# Patient Record
Sex: Male | Born: 1950 | Race: Asian | Hispanic: No | Marital: Married | Smoking: Never smoker
Health system: Southern US, Community
[De-identification: ages and names within clinical notes are randomized; demographics above are authoritative.]

## PROBLEM LIST (undated history)

## (undated) DIAGNOSIS — I10 Essential (primary) hypertension: Secondary | ICD-10-CM

## (undated) DIAGNOSIS — E119 Type 2 diabetes mellitus without complications: Secondary | ICD-10-CM

## (undated) DIAGNOSIS — F419 Anxiety disorder, unspecified: Secondary | ICD-10-CM

## (undated) HISTORY — DX: Anxiety disorder, unspecified: F41.9

## (undated) HISTORY — DX: Essential (primary) hypertension: I10

---

## 2014-11-25 ENCOUNTER — Ambulatory Visit (INDEPENDENT_AMBULATORY_CARE_PROVIDER_SITE_OTHER): Payer: Self-pay

## 2014-11-25 ENCOUNTER — Ambulatory Visit (INDEPENDENT_AMBULATORY_CARE_PROVIDER_SITE_OTHER): Payer: Self-pay | Admitting: Internal Medicine

## 2014-11-25 VITALS — BP 134/82 | HR 61 | Temp 97.9°F | Resp 18 | Ht 68.0 in | Wt 196.0 lb

## 2014-11-25 DIAGNOSIS — F419 Anxiety disorder, unspecified: Secondary | ICD-10-CM

## 2014-11-25 DIAGNOSIS — I152 Hypertension secondary to endocrine disorders: Secondary | ICD-10-CM | POA: Insufficient documentation

## 2014-11-25 DIAGNOSIS — I159 Secondary hypertension, unspecified: Secondary | ICD-10-CM

## 2014-11-25 DIAGNOSIS — I10 Essential (primary) hypertension: Secondary | ICD-10-CM

## 2014-11-25 DIAGNOSIS — E1159 Type 2 diabetes mellitus with other circulatory complications: Secondary | ICD-10-CM | POA: Insufficient documentation

## 2014-11-25 DIAGNOSIS — Z789 Other specified health status: Secondary | ICD-10-CM

## 2014-11-25 DIAGNOSIS — R42 Dizziness and giddiness: Secondary | ICD-10-CM

## 2014-11-25 LAB — POCT URINALYSIS DIPSTICK
Bilirubin, UA: NEGATIVE
Glucose, UA: NEGATIVE
Ketones, UA: NEGATIVE
LEUKOCYTES UA: NEGATIVE
NITRITE UA: NEGATIVE
Protein, UA: NEGATIVE
RBC UA: NEGATIVE
SPEC GRAV UA: 1.01
UROBILINOGEN UA: 0.2
pH, UA: 6.5

## 2014-11-25 LAB — POCT UA - MICROSCOPIC ONLY
Bacteria, U Microscopic: NEGATIVE
CASTS, UR, LPF, POC: NEGATIVE
Crystals, Ur, HPF, POC: NEGATIVE
EPITHELIAL CELLS, URINE PER MICROSCOPY: NEGATIVE
MUCUS UA: NEGATIVE
RBC, URINE, MICROSCOPIC: NEGATIVE
WBC, UR, HPF, POC: NEGATIVE
YEAST UA: NEGATIVE

## 2014-11-25 LAB — POCT CBC
Granulocyte percent: 58 %G (ref 37–80)
HEMATOCRIT: 47.9 % (ref 43.5–53.7)
Hemoglobin: 15.7 g/dL (ref 14.1–18.1)
LYMPH, POC: 2.2 (ref 0.6–3.4)
MCH: 26.4 pg — AB (ref 27–31.2)
MCHC: 32.8 g/dL (ref 31.8–35.4)
MCV: 80.5 fL (ref 80–97)
MID (CBC): 0.6 (ref 0–0.9)
MPV: 8.4 fL (ref 0–99.8)
POC Granulocyte: 3.9 (ref 2–6.9)
POC LYMPH %: 32.9 % (ref 10–50)
POC MID %: 9.1 % (ref 0–12)
Platelet Count, POC: 226 10*3/uL (ref 142–424)
RBC: 5.95 M/uL (ref 4.69–6.13)
RDW, POC: 13.4 %
WBC: 6.7 10*3/uL (ref 4.6–10.2)

## 2014-11-25 LAB — GLUCOSE, POCT (MANUAL RESULT ENTRY): POC Glucose: 95 mg/dl (ref 70–99)

## 2014-11-25 MED ORDER — CLONAZEPAM 1 MG PO TABS: 1.0000 mg | ORAL_TABLET | Freq: Two times a day (BID) | ORAL | Status: DC | PRN

## 2014-11-25 MED ORDER — QUINAPRIL HCL 10 MG PO TABS: 10.0000 mg | ORAL_TABLET | Freq: Every day | ORAL | Status: DC

## 2014-11-25 NOTE — Progress Notes (Signed)
   Subjective:    Patient ID: Omar Barker, male    DOB: 11/29/1950, 64 y.o.   MRN: [id number]  HPI Hx of anxiety and worry about his BP. He is on accupril for HTN. Speaks broken English, has interpretors with him. From [place].   Review of Systems     Objective:   Physical Exam  Constitutional: He is oriented to person, place, and time. He appears well-developed and well-nourished. No distress.  HENT:  Head: Normocephalic.  Eyes: Conjunctivae and EOM are normal. Pupils are equal, round, and reactive to light.  Neck: Normal range of motion. Neck supple. No thyromegaly present.  Cardiovascular: Normal rate, regular rhythm and normal heart sounds.  Exam reveals no gallop.   No murmur heard. Pulmonary/Chest: Effort normal and breath sounds normal.  Abdominal: Soft. Bowel sounds are normal. There is no tenderness.  Musculoskeletal: Normal range of motion.  Neurological: He is alert and oriented to person, place, and time. He exhibits normal muscle tone. Coordination normal.  Psychiatric: His speech is normal. Thought content normal. His mood appears anxious. He is agitated. Cognition and memory are normal.   EKG normal [facility] reading (PRIMARY) by  Dr.Million Maharaj normal cxr.         Assessment & Plan:  HTN Anxiety by hx

## 2014-11-25 NOTE — Patient Instructions (Signed)
Panic Attacks Panic attacks are sudden, short-livedsurges of severe anxiety, fear, or discomfort. They may occur for no reason when you are relaxed, when you are anxious, or when you are sleeping. Panic attacks may occur for a number of reasons:   Healthy people occasionally have panic attacks in extreme, life-threatening situations, such as war or natural disasters. Normal anxiety is a protective mechanism of the body that helps us react to danger (fight or flight response).  Panic attacks are often seen with anxiety disorders, such as panic disorder, social anxiety disorder, generalized anxiety disorder, and phobias. Anxiety disorders cause excessive or uncontrollable anxiety. They may interfere with your relationships or other life activities.  Panic attacks are sometimes seen with other mental illnesses, such as depression and posttraumatic stress disorder.  Certain medical conditions, prescription medicines, and drugs of abuse can cause panic attacks. SYMPTOMS  Panic attacks start suddenly, peak within 20 minutes, and are accompanied by four or more of the following symptoms:  Pounding heart or fast heart rate (palpitations).  Sweating.  Trembling or shaking.  Shortness of breath or feeling smothered.  Feeling choked.  Chest pain or discomfort.  Nausea or strange feeling in your stomach.  Dizziness, light-headedness, or feeling like you will faint.  Chills or hot flushes.  Numbness or tingling in your lips or hands and feet.  Feeling that things are not real or feeling that you are not yourself.  Fear of losing control or going crazy.  Fear of dying. Some of these symptoms can mimic serious medical conditions. For example, you may think you are having a heart attack. Although panic attacks can be very scary, they are not life threatening. DIAGNOSIS  Panic attacks are diagnosed through an assessment by your health care provider. Your health care provider will ask  questions about your symptoms, such as where and when they occurred. Your health care provider will also ask about your medical history and use of alcohol and drugs, including prescription medicines. Your health care provider may order blood tests or other studies to rule out a serious medical condition. Your health care provider may refer you to a mental health professional for further evaluation. TREATMENT   Most healthy people who have one or two panic attacks in an extreme, life-threatening situation will not require treatment.  The treatment for panic attacks associated with anxiety disorders or other mental illness typically involves counseling with a mental health professional, medicine, or a combination of both. Your health care provider will help determine what treatment is best for you.  Panic attacks due to physical illness usually go away with treatment of the illness. If prescription medicine is causing panic attacks, talk with your health care provider about stopping the medicine, decreasing the dose, or substituting another medicine.  Panic attacks due to alcohol or drug abuse go away with abstinence. Some adults need professional help in order to stop drinking or using drugs. HOME CARE INSTRUCTIONS   Take all medicines as directed by your health care provider.   Schedule and attend follow-up visits as directed by your health care provider. It is important to keep all your appointments. SEEK MEDICAL CARE IF:  You are not able to take your medicines as prescribed.  Your symptoms do not improve or get worse. SEEK IMMEDIATE MEDICAL CARE IF:   You experience panic attack symptoms that are different than your usual symptoms.  You have serious thoughts about hurting yourself or others.  You are taking medicine for panic attacks and   have a serious side effect. MAKE SURE YOU:  Understand these instructions.  Will watch your condition.  Will get help right away if you are not  doing well or get worse. Document Released: 11/08/2005 Document Revised: 11/13/2013 Document Reviewed: 06/22/2013 [facility] Patient Information 2015 [place], [place]. This information is not intended to replace advice given to you by your health care provider. Make sure you discuss any questions you have with your health care provider. Generalized Anxiety Disorder Generalized anxiety disorder (GAD) is a mental disorder. It interferes with life functions, including relationships, work, and school. GAD is different from normal anxiety, which everyone experiences at some point in their lives in response to specific life events and activities. Normal anxiety actually helps [place] prepare for and get through these life events and activities. Normal anxiety goes away after the event or activity is over.  GAD causes anxiety that is not necessarily related to specific events or activities. It also causes excess anxiety in proportion to specific events or activities. The anxiety associated with GAD is also difficult to control. GAD can vary from mild to severe. People with severe GAD can have intense waves of anxiety with physical symptoms (panic attacks).  SYMPTOMS The anxiety and worry associated with GAD are difficult to control. This anxiety and worry are related to many life events and activities and also occur more days than not for 6 months or longer. People with GAD also have three or more of the following symptoms (one or more in children):  Restlessness.   Fatigue.  Difficulty concentrating.   Irritability.  Muscle tension.  Difficulty sleeping or unsatisfying sleep. DIAGNOSIS GAD is diagnosed through an assessment by your health care provider. Your health care provider will ask you questions aboutyour mood,physical symptoms, and events in your life. Your health care provider may ask you about your medical history and use of alcohol or drugs, including prescription medicines. Your health care  provider may also do a physical exam and blood tests. Certain medical conditions and the use of certain substances can cause symptoms similar to those associated with GAD. Your health care provider may refer you to a mental health specialist for further evaluation. TREATMENT The following therapies are usually used to treat GAD:   Medication. Antidepressant medication usually is prescribed for long-term daily control. Antianxiety medicines may be added in severe cases, especially when panic attacks occur.   Talk therapy (psychotherapy). Certain types of talk therapy can be helpful in treating GAD by providing support, education, and guidance. A form of talk therapy called cognitive behavioral therapy can teach you healthy ways to think about and react to daily life events and activities.  Stress managementtechniques. These include yoga, meditation, and exercise and can be very helpful when they are practiced regularly. A mental health specialist can help determine which treatment is best for you. Some people see improvement with one therapy. However, other people require a combination of therapies. Document Released: 03/05/2013 Document Revised: 03/25/2014 Document Reviewed: 03/05/2013 [facility] Patient Information 2015 [place], [place]. This information is not intended to replace advice given to you by your health care provider. Make sure you discuss any questions you have with your health care provider. DASH Eating Plan DASH stands for "Dietary Approaches to Stop Hypertension." The DASH eating plan is a healthy eating plan that has been shown to reduce high blood pressure (hypertension). Additional health benefits may include reducing the risk of type 2 diabetes mellitus, heart disease, and stroke. The DASH eating plan may also  help with weight loss. WHAT DO I NEED TO KNOW ABOUT THE DASH EATING PLAN? For the DASH eating plan, you will follow these general guidelines:  Choose foods with a percent  daily value for sodium of less than 5% (as listed on the food label).  Use salt-free seasonings or herbs instead of table salt or sea salt.  Check with your health care provider or pharmacist before using salt substitutes.  Eat lower-sodium products, often labeled as "lower sodium" or "no salt added."  Eat fresh foods.  Eat more vegetables, fruits, and low-fat dairy products.  Choose whole grains. Look for the word "whole" as the first word in the ingredient list.  Choose fish and skinless chicken or [place] more often than red meat. Limit fish, poultry, and meat to 6 oz (170 g) each day.  Limit sweets, desserts, sugars, and sugary drinks.  Choose heart-healthy fats.  Limit cheese to 1 oz (28 g) per day.  Eat more home-cooked food and less restaurant, buffet, and fast food.  Limit fried foods.  Cook foods using methods other than frying.  Limit canned vegetables. If you do use them, rinse them well to decrease the sodium.  When eating at a restaurant, ask that your food be prepared with less salt, or no salt if possible. WHAT FOODS CAN I EAT? Seek help from a dietitian for individual calorie needs. Grains Whole grain or whole wheat bread. Brown rice. Whole grain or whole wheat pasta. Quinoa, bulgur, and whole grain cereals. Low-sodium cereals. Corn or whole wheat flour tortillas. Whole grain cornbread. Whole grain crackers. Low-sodium crackers. Vegetables Fresh or frozen vegetables (raw, steamed, roasted, or grilled). Low-sodium or reduced-sodium tomato and vegetable juices. Low-sodium or reduced-sodium tomato sauce and paste. Low-sodium or reduced-sodium canned vegetables.  Fruits All fresh, canned (in natural juice), or frozen fruits. Meat and Other Protein Products Ground beef (85% or leaner), grass-fed beef, or beef trimmed of fat. Skinless chicken or [place]. Ground chicken or [place]. Pork trimmed of fat. All fish and seafood. Eggs. Dried beans, peas, or lentils. Unsalted  nuts and seeds. Unsalted canned beans. Dairy Low-fat dairy products, such as skim or 1% milk, 2% or reduced-fat cheeses, low-fat ricotta or cottage cheese, or plain low-fat yogurt. Low-sodium or reduced-sodium cheeses. Fats and Oils Tub margarines without trans fats. Light or reduced-fat mayonnaise and salad dressings (reduced sodium). Avocado. Safflower, olive, or canola oils. Natural peanut or almond butter. Other Unsalted popcorn and pretzels. The items listed above may not be a complete list of recommended foods or beverages. Contact your dietitian for more options. WHAT FOODS ARE NOT RECOMMENDED? Grains White bread. White pasta. White rice. Refined cornbread. Bagels and croissants. Crackers that contain trans fat. Vegetables Creamed or fried vegetables. Vegetables in a cheese sauce. Regular canned vegetables. Regular canned tomato sauce and paste. Regular tomato and vegetable juices. Fruits Dried fruits. Canned fruit in light or heavy syrup. Fruit juice. Meat and Other Protein Products Fatty cuts of meat. Ribs, chicken wings, bacon, sausage, bologna, salami, chitterlings, fatback, hot dogs, bratwurst, and packaged luncheon meats. Salted nuts and seeds. Canned beans with salt. Dairy Whole or 2% milk, cream, half-and-half, and cream cheese. Whole-fat or sweetened yogurt. Full-fat cheeses or blue cheese. Nondairy creamers and whipped toppings. Processed cheese, cheese spreads, or cheese curds. Condiments Onion and garlic salt, seasoned salt, table salt, and sea salt. Canned and packaged gravies. Worcestershire sauce. Tartar sauce. Barbecue sauce. Teriyaki sauce. Soy sauce, including reduced sodium. Steak sauce. Fish sauce. Oyster sauce. Cocktail  sauce. Horseradish. Ketchup and mustard. Meat flavorings and tenderizers. Bouillon cubes. Hot sauce. Tabasco sauce. Marinades. Taco seasonings. Relishes. Fats and Oils Butter, stick margarine, lard, shortening, ghee, and bacon fat. Coconut, palm  kernel, or palm oils. Regular salad dressings. Other Pickles and olives. Salted popcorn and pretzels. The items listed above may not be a complete list of foods and beverages to avoid. Contact your dietitian for more information. WHERE CAN I FIND MORE INFORMATION? National Heart, Lung, and Blood Institute: [employer] Document Released: 10/28/2011 Document Revised: 03/25/2014 Document Reviewed: 09/12/2013 [facility] Patient Information 2015 [place], [place]. This information is not intended to replace advice given to you by your health care provider. Make sure you discuss any questions you have with your health care provider. Hypertension Hypertension, commonly called high blood pressure, is when the force of blood pumping through your arteries is too strong. Your arteries are the blood vessels that carry blood from your heart throughout your body. A blood pressure reading consists of a higher number over a lower number, such as 110/72. The higher number (systolic) is the pressure inside your arteries when your heart pumps. The lower number (diastolic) is the pressure inside your arteries when your heart relaxes. Ideally you want your blood pressure below 120/80. Hypertension forces your heart to work harder to pump blood. Your arteries may become narrow or stiff. Having hypertension puts you at risk for heart disease, stroke, and other problems.  RISK FACTORS Some risk factors for high blood pressure are controllable. Others are not.  Risk factors you cannot control include:   Race. You may be at higher risk if you are African American.  Age. Risk increases with age.  Gender. Men are at higher risk than women before age 87 years. After age 41, women are at higher risk than men. Risk factors you can control include:  Not getting enough exercise or physical activity.  Being overweight.  Getting too much fat, sugar, calories, or salt in your  diet.  Drinking too much alcohol. SIGNS AND SYMPTOMS Hypertension does not usually cause signs or symptoms. Extremely high blood pressure (hypertensive crisis) may cause headache, anxiety, shortness of breath, and nosebleed. DIAGNOSIS  To check if you have hypertension, your health care provider will measure your blood pressure while you are seated, with your arm held at the level of your heart. It should be measured at least twice using the same arm. Certain conditions can cause a difference in blood pressure between your right and left arms. A blood pressure reading that is higher than normal on one occasion does not mean that you need treatment. If one blood pressure reading is high, ask your health care provider about having it checked again. TREATMENT  Treating high blood pressure includes making lifestyle changes and possibly taking medicine. Living a healthy lifestyle can help lower high blood pressure. You may need to change some of your habits. Lifestyle changes may include:  Following the DASH diet. This diet is high in fruits, vegetables, and whole grains. It is low in salt, red meat, and added sugars.  Getting at least 2 hours of brisk physical activity every week.  Losing weight if necessary.  Not smoking.  Limiting alcoholic beverages.  Learning ways to reduce stress. If lifestyle changes are not enough to get your blood pressure under control, your health care provider may prescribe medicine. You may need to take more than one. Work closely with your health care provider to understand the risks and benefits. HOME CARE INSTRUCTIONS  Have your blood pressure rechecked as directed by your health care provider.   Take medicines only as directed by your health care provider. Follow the directions carefully. Blood pressure medicines must be taken as prescribed. The medicine does not work as well when you skip doses. Skipping doses also puts you at risk for problems.   Do not  smoke.   Monitor your blood pressure at home as directed by your health care provider. SEEK MEDICAL CARE IF:   You think you are having a reaction to medicines taken.  You have recurrent headaches or feel dizzy.  You have swelling in your ankles.  You have trouble with your vision. SEEK IMMEDIATE MEDICAL CARE IF:  You develop a severe headache or confusion.  You have unusual weakness, numbness, or feel faint.  You have severe chest or abdominal pain.  You vomit repeatedly.  You have trouble breathing. MAKE SURE YOU:   Understand these instructions.  Will watch your condition.  Will get help right away if you are not doing well or get worse. Document Released: 11/08/2005 Document Revised: 03/25/2014 Document Reviewed: 08/31/2013 [facility] Patient Information 2015 [place], [place]. This information is not intended to replace advice given to you by your health care provider. Make sure you discuss any questions you have with your health care provider.

## 2014-11-26 LAB — COMPREHENSIVE METABOLIC PANEL
ALT: 33 U/L (ref 0–53)
AST: 29 U/L (ref 0–37)
Albumin: 4.5 g/dL (ref 3.5–5.2)
Alkaline Phosphatase: 73 U/L (ref 39–117)
BUN: 8 mg/dL (ref 6–23)
CHLORIDE: 103 meq/L (ref 96–112)
CO2: 26 mEq/L (ref 19–32)
Calcium: 9.4 mg/dL (ref 8.4–10.5)
Creat: 0.84 mg/dL (ref 0.50–1.35)
Glucose, Bld: 94 mg/dL (ref 70–99)
POTASSIUM: 4.2 meq/L (ref 3.5–5.3)
SODIUM: 139 meq/L (ref 135–145)
TOTAL PROTEIN: 7.5 g/dL (ref 6.0–8.3)
Total Bilirubin: 1.6 mg/dL — ABNORMAL HIGH (ref 0.2–1.2)

## 2014-11-26 LAB — LIPID PANEL
CHOL/HDL RATIO: 7.1 ratio
Cholesterol: 213 mg/dL — ABNORMAL HIGH (ref 0–200)
HDL: 30 mg/dL — ABNORMAL LOW (ref >39)
LDL CALC: 155 mg/dL — AB (ref 0–99)
TRIGLYCERIDES: 140 mg/dL (ref <150)
VLDL: 28 mg/dL (ref 0–40)

## 2014-11-26 LAB — TSH: TSH: 1.487 u[IU]/mL (ref 0.350–4.500)

## 2014-11-26 LAB — PSA: PSA: 1.62 ng/mL (ref <=4.00)

## 2014-11-29 ENCOUNTER — Telehealth: Payer: Self-pay | Admitting: Family Medicine

## 2014-11-29 NOTE — Telephone Encounter (Signed)
Spoke to sherri at [phone]   "sister"    Explained resukts she explained the results to him  He fully understood the lab results

## 2015-01-27 ENCOUNTER — Telehealth: Payer: Self-pay

## 2015-01-27 NOTE — Telephone Encounter (Signed)
There is a refill on the prescription that Dr. Perrin MalteseGuest sent to the pharmacy for him. He should be able to call the pharmacy for it.  He should also plan to come in for a recheck to make sure that his BP is adequately controlled on the treatment.

## 2015-01-27 NOTE — Telephone Encounter (Signed)
Can we replace medication?

## 2015-01-27 NOTE — Telephone Encounter (Signed)
Pt states his blood pressure was lost,needs new rx   Best phone for pt is [phone]   [facility] [address]

## 2015-01-28 NOTE — Telephone Encounter (Signed)
Left message for pt to call back  °

## 2015-01-29 NOTE — Telephone Encounter (Signed)
Left message for pt to call back  °

## 2015-04-21 ENCOUNTER — Other Ambulatory Visit: Payer: Self-pay | Admitting: Internal Medicine

## 2015-04-24 ENCOUNTER — Other Ambulatory Visit: Payer: Self-pay | Admitting: Internal Medicine

## 2015-04-24 ENCOUNTER — Telehealth: Payer: Self-pay

## 2015-04-24 MED ORDER — QUINAPRIL HCL 10 MG PO TABS: ORAL_TABLET | ORAL | Status: DC

## 2015-04-24 NOTE — Telephone Encounter (Signed)
Patient needs a refill on Accupril. [facility] is completely out of med. [facility]  [phone]

## 2015-04-24 NOTE — Telephone Encounter (Signed)
Rx sent 

## 2015-10-02 ENCOUNTER — Other Ambulatory Visit: Payer: Self-pay | Admitting: *Deleted

## 2015-10-02 MED ORDER — QUINAPRIL HCL 10 MG PO TABS: ORAL_TABLET | ORAL | Status: DC

## 2015-12-09 ENCOUNTER — Other Ambulatory Visit: Payer: Self-pay | Admitting: Internal Medicine

## 2015-12-15 ENCOUNTER — Telehealth: Payer: Self-pay

## 2015-12-15 DIAGNOSIS — I10 Essential (primary) hypertension: Secondary | ICD-10-CM

## 2015-12-15 MED ORDER — QUINAPRIL HCL 10 MG PO TABS: ORAL_TABLET | ORAL | Status: DC

## 2015-12-15 NOTE — Telephone Encounter (Signed)
Advise on 90 day refill. I tried to call pt to get more details and when he planned on following up. No answer.

## 2015-12-15 NOTE — Telephone Encounter (Signed)
[facility] [place] [facility] a [facility] on his ACCUPRIL 10 MG, states he told him to call us but he didn't want to, states he is going out of the country and would like to have A 90 day supply. Please call [phone]      [facility]

## 2015-12-15 NOTE — Telephone Encounter (Signed)
Called and lmom to cb for further details. He has not been here in 1 year. Went ahead and filled one time rx of  #90 tabs with #0 refills. He will need to return for further refills. No more refills from our office without OV.

## 2015-12-16 NOTE — Telephone Encounter (Signed)
Called again, LM

## 2016-04-29 ENCOUNTER — Other Ambulatory Visit: Payer: Self-pay | Admitting: Physician Assistant

## 2016-05-06 ENCOUNTER — Other Ambulatory Visit: Payer: Self-pay | Admitting: Internal Medicine

## 2016-05-12 ENCOUNTER — Ambulatory Visit (INDEPENDENT_AMBULATORY_CARE_PROVIDER_SITE_OTHER): Payer: Self-pay | Admitting: Internal Medicine

## 2016-05-12 VITALS — BP 156/80 | HR 52 | Temp 98.0°F | Resp 16 | Ht 68.0 in | Wt 185.8 lb

## 2016-05-12 DIAGNOSIS — I10 Essential (primary) hypertension: Secondary | ICD-10-CM

## 2016-05-12 DIAGNOSIS — Z209 Contact with and (suspected) exposure to unspecified communicable disease: Secondary | ICD-10-CM

## 2016-05-12 LAB — CBC WITH DIFFERENTIAL/PLATELET
BASOS ABS: 51 {cells}/uL (ref 0–200)
Basophils Relative: 1 %
EOS ABS: 153 {cells}/uL (ref 15–500)
Eosinophils Relative: 3 %
HCT: 44.3 % (ref 38.5–50.0)
HEMOGLOBIN: 15.1 g/dL (ref 13.2–17.1)
Lymphocytes Relative: 32 %
Lymphs Abs: 1632 cells/uL (ref 850–3900)
MCH: 26.7 pg — ABNORMAL LOW (ref 27.0–33.0)
MCHC: 34.1 g/dL (ref 32.0–36.0)
MCV: 78.4 fL — AB (ref 80.0–100.0)
MONO ABS: 408 {cells}/uL (ref 200–950)
MONOS PCT: 8 %
MPV: 10.9 fL (ref 7.5–12.5)
NEUTROS ABS: 2856 {cells}/uL (ref 1500–7800)
Neutrophils Relative %: 56 %
Platelets: 219 10*3/uL (ref 140–400)
RBC: 5.65 MIL/uL (ref 4.20–5.80)
RDW: 14.3 % (ref 11.0–15.0)
WBC: 5.1 10*3/uL (ref 3.8–10.8)

## 2016-05-12 LAB — LIPID PANEL
CHOL/HDL RATIO: 6.4 ratio — AB (ref <=5.0)
Cholesterol: 235 mg/dL — ABNORMAL HIGH (ref 125–200)
HDL: 37 mg/dL — AB (ref >=40)
LDL CALC: 171 mg/dL — AB (ref <130)
Triglycerides: 136 mg/dL (ref <150)
VLDL: 27 mg/dL (ref <30)

## 2016-05-12 LAB — COMPREHENSIVE METABOLIC PANEL
ALT: 21 U/L (ref 9–46)
AST: 24 U/L (ref 10–35)
Albumin: 4.1 g/dL (ref 3.6–5.1)
Alkaline Phosphatase: 74 U/L (ref 40–115)
BUN: 7 mg/dL (ref 7–25)
CHLORIDE: 107 mmol/L (ref 98–110)
CO2: 25 mmol/L (ref 20–31)
CREATININE: 0.78 mg/dL (ref 0.70–1.25)
Calcium: 9.3 mg/dL (ref 8.6–10.3)
GLUCOSE: 101 mg/dL — AB (ref 65–99)
POTASSIUM: 4.4 mmol/L (ref 3.5–5.3)
SODIUM: 140 mmol/L (ref 135–146)
TOTAL PROTEIN: 7.1 g/dL (ref 6.1–8.1)
Total Bilirubin: 1.2 mg/dL (ref 0.2–1.2)

## 2016-05-12 MED ORDER — LISINOPRIL-HYDROCHLOROTHIAZIDE 10-12.5 MG PO TABS: 1.0000 | ORAL_TABLET | Freq: Every day | ORAL | Status: DC

## 2016-05-12 NOTE — Patient Instructions (Addendum)
Things to consider doing 1. Colonoscopy to screen for colon cancer 2. Pneumonia vaccine    IF you received an x-ray today, you will receive an invoice from [facility]. Please contact [facility] at [phone] with questions or concerns regarding your invoice.   IF you received labwork today, you will receive an invoice from [facility]. Please contact [facility] at [phone] with questions or concerns regarding your invoice.   Our billing staff will not be able to assist you with questions regarding bills from these companies.  You will be contacted with the lab results as soon as they are available. The fastest way to get your results is to activate your My Chart account. Instructions are located on the last page of this paperwork. If you have not heard from us regarding the results in 2 weeks, please contact this office.

## 2016-05-12 NOTE — Progress Notes (Addendum)
Subjective:  By signing my name below, I, Omar Barker, attest that this documentation has been prepared under the direction and in the presence of Omar Sia, MD. Electronically Signed: Stann Barker, Scribe. 05/12/2016 , 2:11 PM .  Patient was seen in Room 12 .   Patient ID: Omar Barker, male    DOB: 11-03-1951, 65 y.o.   MRN: [id number] Chief Complaint  Patient presents with  . Medication Refill    quinapril    Here w/wife and older son who helps translate HPI Omar Barker is a 65 y.o. male who presents to [facility] requesting medication refill for his quinapril. He's been taking his medication for 5-6 years. His last visit was in Jan 2016. He reports taking his medications everyday. And he's generally feeling well. Didn't f/u sooner as advised  He checks his BP everyday at home, running about: 140-165/80.   He mentions having tightness in his back when his BP starts running over 160. He notes having relief after taking his BP medication. No chest pain or SOB  Patient Active Problem List   Diagnosis Date Noted  . HTN (hypertension) 11/25/2014    Current outpatient prescriptions:  .  clonazePAM (KLONOPIN) 1 MG tablet, Take 1 tablet (1 mg total) by mouth 2 (two) times daily as needed for anxiety., Disp: 60 tablet, Rfl: 1 .  quinapril (ACCUPRIL) 10 MG tablet, TAKE 1 TABLET (10 MG TOTAL) BY MOUTH DAILY, Disp: 90 tablet, Rfl: 0 No Known Allergies   Had p-vax and Td 2 y ago in [place]  Review of Systems  Constitutional: Negative for activity change, appetite change, fatigue and unexpected weight change.  HENT: Negative for trouble swallowing.   Eyes: Negative for visual disturbance.  Respiratory: Negative for cough, chest tightness and shortness of breath.   Cardiovascular: Negative for chest pain, palpitations and leg swelling.  Gastrointestinal: Negative for abdominal pain and blood in stool.  Neurological: Negative for dizziness, light-headedness and headaches.    Psychiatric/Behavioral: Negative for sleep disturbance.      Objective:   Physical Exam  Constitutional: He is oriented to person, place, and time. He appears well-developed and well-nourished. No distress.  HENT:  Head: Normocephalic and atraumatic.  Eyes: Conjunctivae and EOM are normal. Pupils are equal, round, and reactive to light.  Neck: Neck supple. Carotid bruit is not present. No thyromegaly present.  Cardiovascular: Normal rate, regular rhythm, normal heart sounds and intact distal pulses.   No murmur heard. Pulmonary/Chest: Effort normal and breath sounds normal. No respiratory distress. He has no wheezes.  Musculoskeletal: Normal range of motion.  No peripheral edema  Neurological: He is alert and oriented to person, place, and time. No cranial nerve deficit.  Skin: Skin is warm and dry.  Psychiatric: He has a normal mood and affect. His behavior is normal. Thought content normal.  Nursing note and vitals reviewed.   BP 156/80 mmHg  Pulse 52  Temp(Src) 98 F (36.7 C) (Oral)  Resp 16  Ht  (1.727 m)  Wt 185 lb 12.8 oz (84.278 kg)  BMI 28.26 kg/m2  SpO2 99%       Assessment & Plan:  Essential hypertension - Plan: CBC with Differential/Platelet, Comprehensive metabolic panel, PSA, Lipid panel  Exposure to communicable disease - Plan: Hepatitis C antibody  Meds ordered this encounter  Medications  . lisinopril-hydrochlorothiazide (PRINZIDE,ZESTORETIC) 10-12.5 MG tablet    Sig: Take 1 tablet by mouth daily.    Dispense:  90 tablet    Refill:  3  Patient Instructions   Things to consider doing 1. Colonoscopy to screen for colon cancer 2. P-13 at 5265  I have completed the patient encounter in its entirety as documented by the scribe, with editing by me where necessary. Omar Barker P. Merla Richesoolittle, M.D.   Addend-labs LDL high--sent in lipitor 20 F/u 3 mos

## 2016-05-13 LAB — HEPATITIS C ANTIBODY: HCV AB: NEGATIVE

## 2016-05-13 LAB — PSA: PSA: 1.6 ng/mL (ref <=4.00)

## 2016-05-16 ENCOUNTER — Encounter: Payer: Self-pay | Admitting: Internal Medicine

## 2016-05-16 MED ORDER — ATORVASTATIN CALCIUM 20 MG PO TABS: 20.0000 mg | ORAL_TABLET | Freq: Every day | ORAL | Status: DC

## 2016-05-16 NOTE — Addendum Note (Signed)
Addended by: Tonye PearsonOLITTLE, Mahi Zabriskie P on: 05/16/2016 09:07 AM   Modules accepted: Orders

## 2016-07-10 IMAGING — CR DG CHEST 2V
2 series · 2 of 2 positions shown · non-contrast
Comparison: None.

CLINICAL DATA: Anxiety; history of hypertension

EXAM:
CHEST  2 VIEW

[PA]
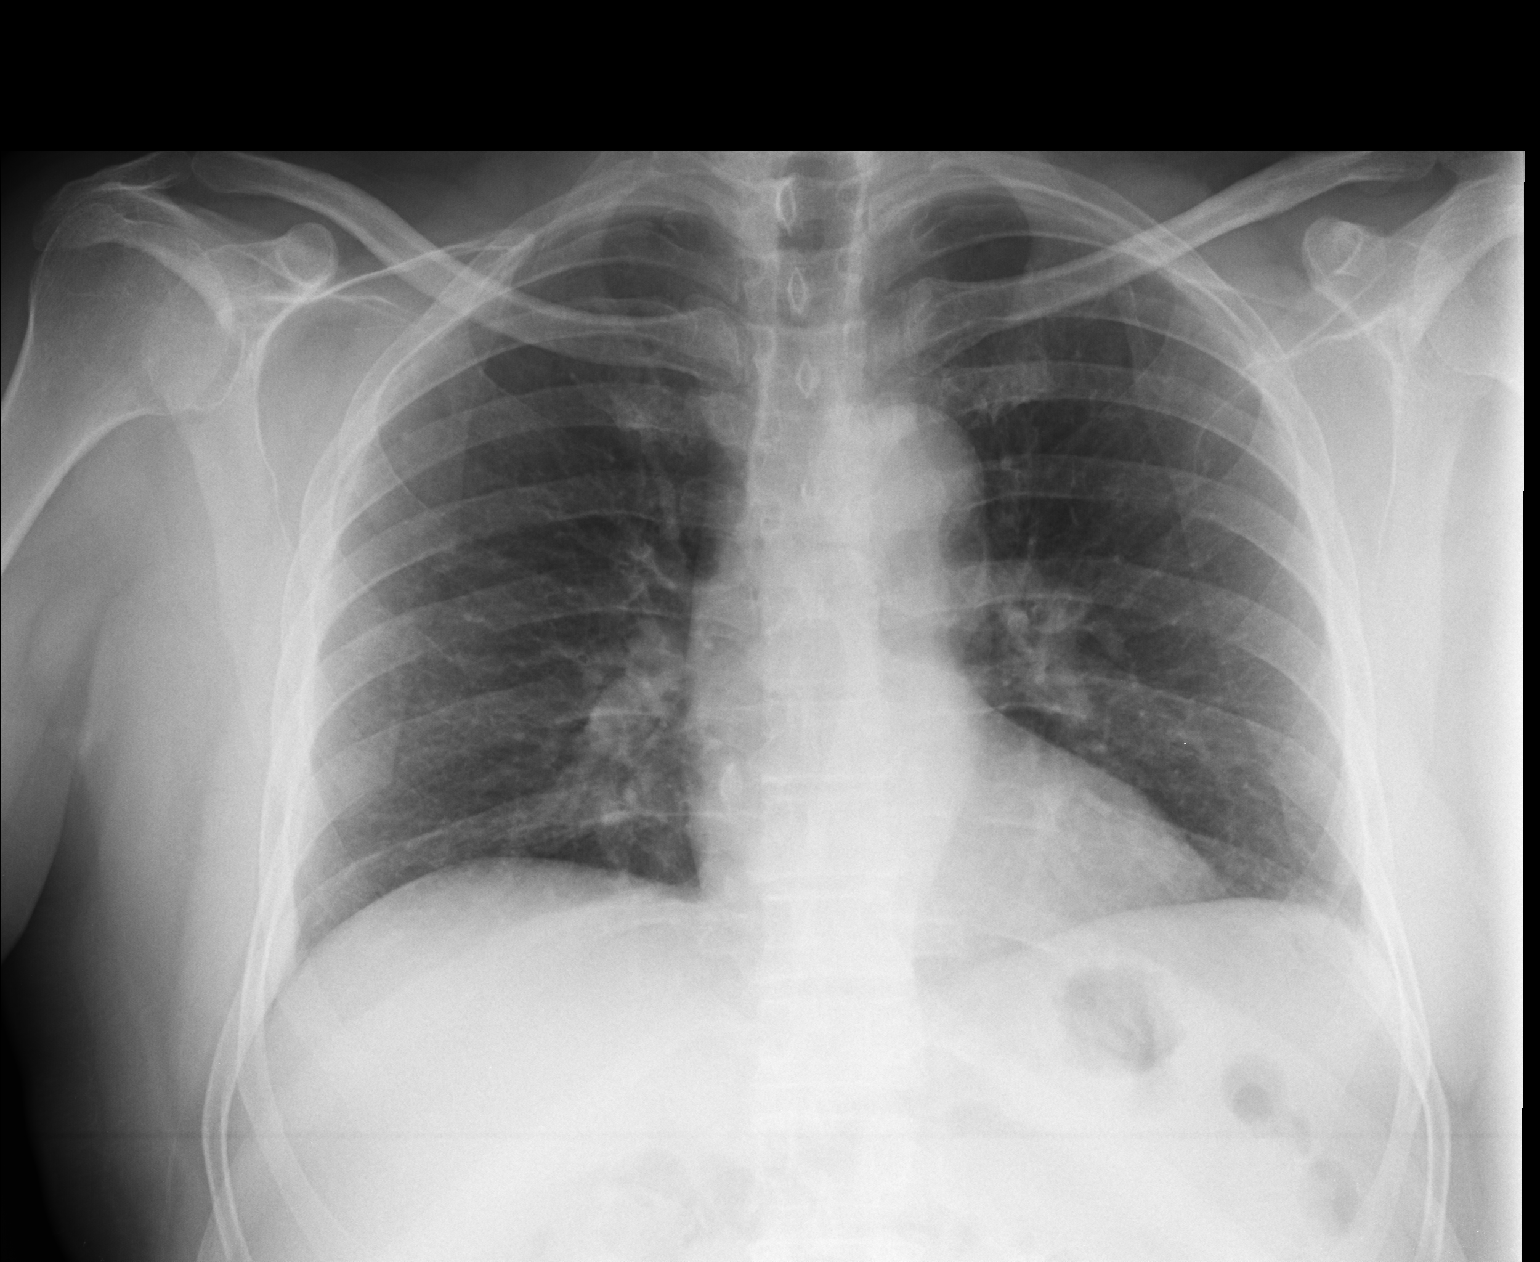

[lateral]
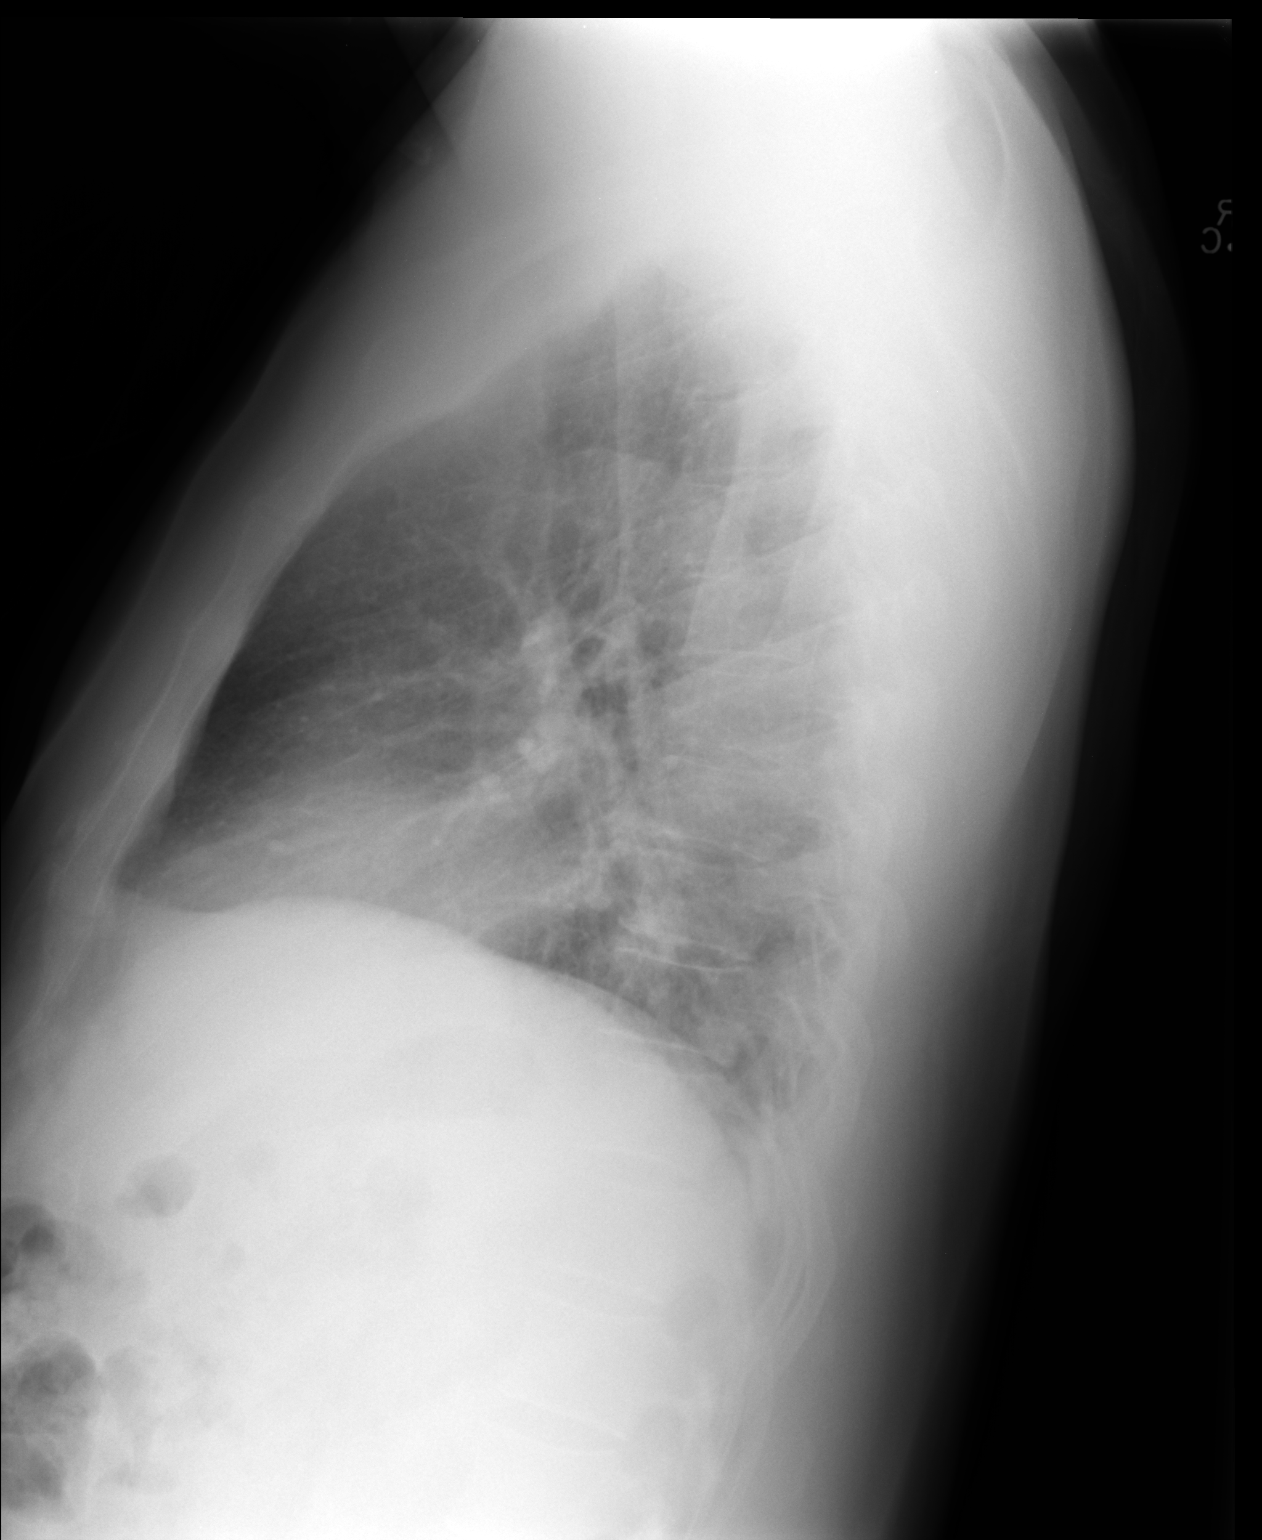

[2 of 2 positions shown; findings below may reference images not displayed]

FINDINGS: The lungs are reasonably well inflated. There is no focal
infiltrate. There is no pleural effusion or pneumothorax. The heart
and pulmonary vascularity are normal. There is mild tortuosity of
the descending thoracic aorta. The bony thorax is unremarkable.
IMPRESSION: There is no active cardiopulmonary disease.

## 2017-05-24 ENCOUNTER — Telehealth: Payer: Self-pay

## 2017-05-24 NOTE — Telephone Encounter (Signed)
Spoke with pt he will give us a call back for a f/u Ov for med refill

## 2017-05-24 NOTE — Telephone Encounter (Signed)
[facility] Lis[facility]-HCTZ 10-12.5mg  -denied Pt need ov. Pt last seen on 05/12/16

## 2017-05-27 ENCOUNTER — Telehealth: Payer: Self-pay | Admitting: *Deleted

## 2017-05-27 NOTE — Telephone Encounter (Signed)
Pt refill request denied. Pt needs OV.

## 2017-05-31 ENCOUNTER — Ambulatory Visit (INDEPENDENT_AMBULATORY_CARE_PROVIDER_SITE_OTHER): Payer: Self-pay | Admitting: Emergency Medicine

## 2017-05-31 ENCOUNTER — Encounter: Payer: Self-pay | Admitting: Emergency Medicine

## 2017-05-31 VITALS — BP 149/81 | HR 64 | Temp 98.0°F | Resp 16 | Ht 67.0 in | Wt 192.8 lb

## 2017-05-31 DIAGNOSIS — I10 Essential (primary) hypertension: Secondary | ICD-10-CM

## 2017-05-31 DIAGNOSIS — E785 Hyperlipidemia, unspecified: Secondary | ICD-10-CM | POA: Insufficient documentation

## 2017-05-31 MED ORDER — LISINOPRIL-HYDROCHLOROTHIAZIDE 10-12.5 MG PO TABS: 1.0000 | ORAL_TABLET | Freq: Every day | ORAL | 3 refills | Status: DC

## 2017-05-31 MED ORDER — ATORVASTATIN CALCIUM 20 MG PO TABS: 20.0000 mg | ORAL_TABLET | Freq: Every day | ORAL | 3 refills | Status: DC

## 2017-05-31 NOTE — Patient Instructions (Addendum)
pc    IF you received an x-ray today, you will receive an invoice from [facility]. Please contact [facility] at [phone] with questions or concerns regarding your invoice.   IF you received labwork today, you will receive an invoice from [facility]. Please contact [facility] at [phone] with questions or concerns regarding your invoice.   Our billing staff will not be able to assist you with questions regarding bills from these companies.  You will be contacted with the lab results as soon as they are available. The fastest way to get your results is to activate your My Chart account. Instructions are located on the last page of this paperwork. If you have not heard from us regarding the results in 2 weeks, please contact this office.     Hypertension Hypertension is another name for high blood pressure. High blood pressure forces your heart to work harder to pump blood. This can cause problems over time. There are two numbers in a blood pressure reading. There is a top number (systolic) over a bottom number (diastolic). It is best to have a blood pressure below 120/80. Healthy choices can help lower your blood pressure. You may need medicine to help lower your blood pressure if:  Your blood pressure cannot be lowered with healthy choices.  Your blood pressure is higher than 130/80.  Follow these instructions at home: Eating and drinking  If directed, follow the DASH eating plan. This diet includes: ? Filling half of your plate at each meal with fruits and vegetables. ? Filling one quarter of your plate at each meal with whole grains. Whole grains include whole wheat pasta, brown rice, and whole grain bread. ? Eating or drinking low-fat dairy products, such as skim milk or low-fat yogurt. ? Filling one quarter of your plate at each meal with low-fat (lean) proteins. Low-fat proteins include fish, skinless chicken, eggs, beans, and tofu. ? Avoiding fatty meat,  cured and processed meat, or chicken with skin. ? Avoiding premade or processed food.  Eat less than 1,500 mg of salt (sodium) a day.  Limit alcohol use to no more than 1 drink a day for nonpregnant women and 2 drinks a day for men. One drink equals 12 oz of beer, 5 oz of wine, or 1 oz of hard liquor. Lifestyle  Work with your doctor to stay at a healthy weight or to lose weight. Ask your doctor what the best weight is for you.  Get at least 30 minutes of exercise that causes your heart to beat faster (aerobic exercise) most days of the week. This may include walking, swimming, or biking.  Get at least 30 minutes of exercise that strengthens your muscles (resistance exercise) at least 3 days a week. This may include lifting weights or pilates.  Do not use any products that contain nicotine or tobacco. This includes cigarettes and e-cigarettes. If you need help quitting, ask your doctor.  Check your blood pressure at home as told by your doctor.  Keep all follow-up visits as told by your doctor. This is important. Medicines  Take over-the-counter and prescription medicines only as told by your doctor. Follow directions carefully.  Do not skip doses of blood pressure medicine. The medicine does not work as well if you skip doses. Skipping doses also puts you at risk for problems.  Ask your doctor about side effects or reactions to medicines that you should watch for. Contact a doctor if:  You think you are having a reaction to  the medicine you are taking.  You have headaches that keep coming back (recurring).  You feel dizzy.  You have swelling in your ankles.  You have trouble with your vision. Get help right away if:  You get a very bad headache.  You start to feel confused.  You feel weak or numb.  You feel faint.  You get very bad pain in your: ? Chest. ? Belly (abdomen).  You throw up (vomit) more than once.  You have trouble  breathing. Summary  Hypertension is another name for high blood pressure.  Making healthy choices can help lower blood pressure. If your blood pressure cannot be controlled with healthy choices, you may need to take medicine. This information is not intended to replace advice given to you by your health care provider. Make sure you discuss any questions you have with your health care provider. Document Released: 04/26/2008 Document Revised: 10/06/2016 Document Reviewed: 10/06/2016 Elsevier Interactive Patient Education  [employer].

## 2017-05-31 NOTE — Progress Notes (Signed)
Omar Barker 66 y.o.   Chief Complaint  Patient presents with  . Medication Refill    lisinopril and atorvastatin    HISTORY OF PRESENT ILLNESS: This is a 66 y.o. male with h/o HTN; ran out of medications; feels well and has no complaints.  HPI   Prior to Admission medications   Medication Sig Start Date End Date Taking? Authorizing Provider  atorvastatin (LIPITOR) 20 MG tablet Take 1 tablet (20 mg total) by mouth daily. 05/16/16  Yes Tonye Pearson, MD  lisinopril-hydrochlorothiazide (PRINZIDE,ZESTORETIC) 10-12.5 MG tablet Take 1 tablet by mouth daily. 05/12/16  Yes Tonye Pearson, MD    No Known Allergies  Patient Active Problem List   Diagnosis Date Noted  . HTN (hypertension) 11/25/2014    Past Medical History:  Diagnosis Date  . Anxiety   . Hypertension     No past surgical history on file.  Social History   Social History  . Marital status: Married    Spouse name: N/A  . Number of children: N/A  . Years of education: N/A   Occupational History  . Not on file.   Social History Main Topics  . Smoking status: Never Smoker  . Smokeless tobacco: Never Used  . Alcohol use No  . Drug use: No  . Sexual activity: Not on file   Other Topics Concern  . Not on file   Social History Narrative  . No narrative on file    No family history on file.   Review of Systems  Constitutional: Negative for chills and fever.  HENT: Negative.   Eyes: Negative.   Respiratory: Negative.  Negative for cough and shortness of breath.   Cardiovascular: Negative.  Negative for chest pain, palpitations and leg swelling.  Gastrointestinal: Negative.  Negative for abdominal pain, diarrhea, nausea and vomiting.  Genitourinary: Negative for dysuria and hematuria.  Musculoskeletal: Negative for myalgias and neck pain.  Skin: Negative.  Negative for rash.  Neurological: Negative.  Negative for dizziness and headaches.  Endo/Heme/Allergies: Negative.   All other  systems reviewed and are negative.  Vitals:   05/31/17 1028  BP: (!) 149/81  Pulse: 64  Resp: 16  Temp: 98 F (36.7 C)     Physical Exam  Constitutional: He is oriented to person, place, and time. He appears well-developed and well-nourished.  HENT:  Head: Normocephalic and atraumatic.  Right Ear: External ear normal.  Left Ear: External ear normal.  Nose: Nose normal.  Mouth/Throat: Oropharynx is clear and moist.  Eyes: Conjunctivae and EOM are normal. Pupils are equal, round, and reactive to light.  Neck: Normal range of motion. Neck supple. No JVD present. No thyromegaly present.  Cardiovascular: Normal rate, regular rhythm, normal heart sounds and intact distal pulses.   Pulmonary/Chest: Effort normal and breath sounds normal.  Abdominal: Soft. Bowel sounds are normal. He exhibits no distension. There is no tenderness.  Musculoskeletal: Normal range of motion.  Lymphadenopathy:    He has no cervical adenopathy.  Neurological: He is alert and oriented to person, place, and time. No sensory deficit. He exhibits normal muscle tone.  Skin: Skin is warm and dry. Capillary refill takes less than 2 seconds. No rash noted.  Psychiatric: He has a normal mood and affect. His behavior is normal.  Vitals reviewed.    ASSESSMENT & PLAN: Maricela Curet was seen today for medication refill.  Diagnoses and all orders for this visit:  Essential hypertension  Dyslipidemia  Other orders -  atorvastatin (LIPITOR) 20 MG tablet; Take 1 tablet (20 mg total) by mouth daily. -     lisinopril-hydrochlorothiazide (PRINZIDE,ZESTORETIC) 10-12.5 MG tablet; Take 1 tablet by mouth daily.    Patient Instructions   pc    IF you received an x-ray today, you will receive an invoice from [facility]. Please contact [facility] at [phone] with questions or concerns regarding your invoice.   IF you received labwork today, you will receive an invoice from [facility]. Please  contact [facility] at [phone] with questions or concerns regarding your invoice.   Our billing staff will not be able to assist you with questions regarding bills from these companies.  You will be contacted with the lab results as soon as they are available. The fastest way to get your results is to activate your My Chart account. Instructions are located on the last page of this paperwork. If you have not heard from us regarding the results in 2 weeks, please contact this office.     Hypertension Hypertension is another name for high blood pressure. High blood pressure forces your heart to work harder to pump blood. This can cause problems over time. There are two numbers in a blood pressure reading. There is a top number (systolic) over a bottom number (diastolic). It is best to have a blood pressure below 120/80. Healthy choices can help lower your blood pressure. You may need medicine to help lower your blood pressure if:  Your blood pressure cannot be lowered with healthy choices.  Your blood pressure is higher than 130/80.  Follow these instructions at home: Eating and drinking  If directed, follow the DASH eating plan. This diet includes: ? Filling half of your plate at each meal with fruits and vegetables. ? Filling one quarter of your plate at each meal with whole grains. Whole grains include whole wheat pasta, brown rice, and whole grain bread. ? Eating or drinking low-fat dairy products, such as skim milk or low-fat yogurt. ? Filling one quarter of your plate at each meal with low-fat (lean) proteins. Low-fat proteins include fish, skinless chicken, eggs, beans, and tofu. ? Avoiding fatty meat, cured and processed meat, or chicken with skin. ? Avoiding premade or processed food.  Eat less than 1,500 mg of salt (sodium) a day.  Limit alcohol use to no more than 1 drink a day for nonpregnant women and 2 drinks a day for men. One drink equals 12 oz of beer, 5 oz of wine, or  1 oz of hard liquor. Lifestyle  Work with your doctor to stay at a healthy weight or to lose weight. Ask your doctor what the best weight is for you.  Get at least 30 minutes of exercise that causes your heart to beat faster (aerobic exercise) most days of the week. This may include walking, swimming, or biking.  Get at least 30 minutes of exercise that strengthens your muscles (resistance exercise) at least 3 days a week. This may include lifting weights or pilates.  Do not use any products that contain nicotine or tobacco. This includes cigarettes and e-cigarettes. If you need help quitting, ask your doctor.  Check your blood pressure at home as told by your doctor.  Keep all follow-up visits as told by your doctor. This is important. Medicines  Take over-the-counter and prescription medicines only as told by your doctor. Follow directions carefully.  Do not skip doses of blood pressure medicine. The medicine does not work as well if you skip doses.  Skipping doses also puts you at risk for problems.  Ask your doctor about side effects or reactions to medicines that you should watch for. Contact a doctor if:  You think you are having a reaction to the medicine you are taking.  You have headaches that keep coming back (recurring).  You feel dizzy.  You have swelling in your ankles.  You have trouble with your vision. Get help right away if:  You get a very bad headache.  You start to feel confused.  You feel weak or numb.  You feel faint.  You get very bad pain in your: ? Chest. ? Belly (abdomen).  You throw up (vomit) more than once.  You have trouble breathing. Summary  Hypertension is another name for high blood pressure.  Making healthy choices can help lower blood pressure. If your blood pressure cannot be controlled with healthy choices, you may need to take medicine. This information is not intended to replace advice given to you by your health care  provider. Make sure you discuss any questions you have with your health care provider. Document Released: 04/26/2008 Document Revised: 10/06/2016 Document Reviewed: 10/06/2016 Elsevier Interactive Patient Education  2018 Elsevier Inc.      Edwina Barth, MD [facility]

## 2018-05-12 ENCOUNTER — Other Ambulatory Visit: Payer: Self-pay | Admitting: *Deleted

## 2018-05-12 ENCOUNTER — Other Ambulatory Visit: Payer: Self-pay | Admitting: Emergency Medicine

## 2018-05-12 NOTE — Telephone Encounter (Signed)
Patient called, unable to leave VM due to no mailbox set up. Patient is due for a CPE/Labs, no current labs on file since 2017.  Atorvastatin and Lisinipril-HCTZ refill Last OV:05/31/17 Last refill:05/31/17 90 tab/3 refill for both medications ZOX:[id number]:[id number] [place], [place] - [address] [phone] (Phone) [phone] (Fax)

## 2018-06-07 ENCOUNTER — Other Ambulatory Visit: Payer: Self-pay | Admitting: Emergency Medicine

## 2018-06-08 NOTE — Telephone Encounter (Signed)
Lipitor 20 mg and Prinzide 10-12.5 mg refill requests  LOV 05/31/17 with Dr. Alvy BimlerSagardia    [facility]t  [id number] [place] [place]  [id number]

## 2018-06-20 ENCOUNTER — Encounter: Payer: Self-pay | Admitting: Urgent Care

## 2018-06-20 ENCOUNTER — Ambulatory Visit: Payer: Self-pay | Admitting: Urgent Care

## 2018-06-20 VITALS — BP 120/75 | HR 75 | Temp 98.6°F | Resp 17 | Ht 67.0 in | Wt 197.0 lb

## 2018-06-20 DIAGNOSIS — R200 Anesthesia of skin: Secondary | ICD-10-CM

## 2018-06-20 DIAGNOSIS — R202 Paresthesia of skin: Secondary | ICD-10-CM

## 2018-06-20 DIAGNOSIS — E785 Hyperlipidemia, unspecified: Secondary | ICD-10-CM

## 2018-06-20 DIAGNOSIS — I10 Essential (primary) hypertension: Secondary | ICD-10-CM

## 2018-06-20 DIAGNOSIS — B370 Candidal stomatitis: Secondary | ICD-10-CM

## 2018-06-20 MED ORDER — FLUCONAZOLE 100 MG PO TABS: ORAL_TABLET | ORAL | 0 refills | Status: DC

## 2018-06-20 MED ORDER — ATORVASTATIN CALCIUM 20 MG PO TABS: 20.0000 mg | ORAL_TABLET | Freq: Every day | ORAL | 1 refills | Status: DC

## 2018-06-20 MED ORDER — LISINOPRIL-HYDROCHLOROTHIAZIDE 10-12.5 MG PO TABS: 1.0000 | ORAL_TABLET | Freq: Every day | ORAL | 1 refills | Status: DC

## 2018-06-20 MED ORDER — LISINOPRIL-HYDROCHLOROTHIAZIDE 20-25 MG PO TABS: 0.5000 | ORAL_TABLET | Freq: Every day | ORAL | 1 refills | Status: DC

## 2018-06-20 NOTE — Progress Notes (Signed)
   MRN: [id number] DOB: 08/23/1951  Subjective:   Omar Barker is a 67 y.o. male presenting for follow up on Hypertension. Currently managed with lisinopril-hydrochlorothiazide, at a dose of 10-12.5 mg.  He is also taking atorvastatin at 20 mg once daily.  He does want to address a several week history of waking up with white plaque over his tongue that he needs to clean off each morning.  He is also had numbness and tingling of his hands when he wakes up every day resolved with shaking his hands throughout the day.  Denies history of diabetes.  Denies dizziness, chronic headache, blurred vision, chest pain, shortness of breath, heart racing, palpitations, nausea, vomiting, abdominal pain, hematuria, lower leg swelling. Denies smoking cigarettes or drinking alcohol.   Omar Barker has a current medication list which includes the following prescription(s): atorvastatin and lisinopril-hydrochlorothiazide. Also has No Known Allergies.  Omar Barker  has a past medical history of Anxiety and Hypertension. Denies past surgical history.   Objective:   Vitals: BP 120/75   Pulse 75   Temp 98.6 F (37 C) (Oral)   Resp 17   Ht 5\' 7"  (1.702 m)   Wt 197 lb (89.4 kg)   SpO2 98%   BMI 30.85 kg/m   Physical Exam  Constitutional: He is oriented to person, place, and time. He appears well-developed and well-nourished.  HENT:  Mouth/Throat: Oropharynx is clear and moist.  Eyes: Right eye exhibits no discharge. Left eye exhibits no discharge. No scleral icterus.  Cardiovascular: Normal rate, regular rhythm, normal heart sounds and intact distal pulses. Exam reveals no gallop and no friction rub.  No murmur heard. Pulmonary/Chest: Effort normal and breath sounds normal. No stridor. No respiratory distress. He has no wheezes. He has no rales.  Neurological: He is alert and oriented to person, place, and time.  Skin: Skin is warm and dry.  Psychiatric: He has a normal mood and affect.   Assessment and Plan :     Essential hypertension - Plan: Comprehensive metabolic panel  Dyslipidemia - Plan: Lipid panel  Oral thrush - Plan: Hemoglobin A1c  Numbness and tingling in both hands - Plan: Hemoglobin A1c  I refilled patient's medications as he requested for atorvastatin 20 mg once daily.  [facility] so that [facility] would be more [facility] to patient but they only dispense the lisinopril-hydrochlorothiazide at a dose of 20-25 mg.  Patient is okay with doing 1/2 tablet once daily.  For his oral thrush we will start oral Diflucan.  Labs pending.  Counseled that dietary modifications are in order as I am worried he has diabetes due to his symptoms that.  We will follow-up with lab results.  Wallis BambergMario Kyleigha Markert, PA-C [facility] [phone] 06/20/2018  11:59 AM

## 2018-06-20 NOTE — Patient Instructions (Addendum)
Oral Thrush, Adult Oral thrush, also called oral candidiasis, is a fungal infection that develops in the mouth and throat and on the tongue. It causes white patches to form on the mouth and tongue. Thrush is most common in older adults, but it can occur at any age. Many cases of thrush are mild, but this infection can also be serious. Thrush can be a repeated (recurrent) problem for certain people who have a weak body defense system (immune system). The weakness can be caused by chronic illnesses, or by taking medicines that limit the body's ability to fight infection. If a person has difficulty fighting infection, the fungus that causes thrush can spread through the body. This can cause life-threatening blood or organ infections. What are the causes? This condition is caused by a fungus (yeast) called Candida albicans.  This fungus is normally present in small amounts in the mouth and on other mucous membranes. It usually causes no harm.  If conditions are present that allow the fungus to grow without control, it invades surrounding tissues and becomes an infection.  Other Candida species can also lead to thrush (rare).  What increases the risk? This condition is more likely to develop in:  People with a weakened immune system.  Older adults.  People with HIV (human immunodeficiency virus).  People with diabetes.  People with dry mouth (xerostomia).  Pregnant women.  People with poor dental care, especially people who have false teeth.  People who use antibiotic medicines.  What are the signs or symptoms? Symptoms of this condition can vary from mild and moderate to severe and persistent. Symptoms may include:  A burning feeling in the mouth and throat. This can occur at the start of a thrush infection.  White patches that stick to the mouth and tongue. The tissue around the patches may be red, raw, and painful. If rubbed (during tooth brushing, for example), the patches and the  tissue of the mouth may bleed easily.  A bad taste in the mouth or difficulty tasting foods.  A cottony feeling in the mouth.  Pain during eating and swallowing.  Poor appetite.  Cracking at the corners of the mouth.  How is this diagnosed? This condition is diagnosed based on:  Physical exam. Your health care provider will look in your mouth.  Health history. Your health care provider will ask you questions about your health.  How is this treated? This condition is treated with medicines called antifungals, which prevent the growth of fungi. These medicines are either applied directly to the affected area (topical) or swallowed (oral). The treatment will depend on the severity of the condition. Mild thrush Mild cases of thrush may clear up with the use of an antifungal mouth rinse or lozenges. Treatment usually lasts about 14 days. Moderate to severe thrush  More severe thrush infections that have spread to the esophagus are treated with an oral antifungal medicine. A topical antifungal medicine may also be used.  For some severe infections, treatment may need to continue for more than 14 days.  Oral antifungal medicines are rarely used during pregnancy because they may be harmful to the unborn child. If you are pregnant, talk with your health care provider about options for treatment. Persistent or recurrent thrush For cases of thrush that do not go away or keep coming back:  Treatment may be needed twice as long as the symptoms last.  Treatment will include both oral and topical antifungal medicines.  People with a weakened immune   system can take an antifungal medicine on a continuous basis to prevent thrush infections.  It is important to treat conditions that make a person more likely to get thrush, such as diabetes or HIV. Follow these instructions at home: Medicines  Take over-the-counter and prescription medicines only as told by your health care provider.  Talk  with your health care provider about an over-the-counter medicine called gentian violet, which kills bacteria and fungi. Relieving soreness and discomfort To help reduce the discomfort of thrush:  Drink cold liquids such as water or iced tea.  Try flavored ice treats or frozen juices.  Eat foods that are easy to swallow, such as gelatin, ice cream, or custard.  Try drinking from a straw if the patches in your mouth are painful.  General instructions  Eat plain, unflavored yogurt as directed by your health care provider. Check the label to make sure the yogurt contains live cultures. This yogurt can help healthy bacteria to grow in the mouth and can stop the growth of the fungus that causes thrush.  If you wear dentures, remove the dentures before going to bed, brush them vigorously, and soak them in a cleaning solution as directed by your health care provider.  Rinse your mouth with a warm salt-water mixture several times a day. To make a salt-water mixture, completely dissolve 1/2-1 tsp of salt in 1 cup of warm water. Contact a health care provider if:  Your symptoms are getting worse or are not improving within 7 days of starting treatment.  You have symptoms of a spreading infection, such as white patches on the skin outside of the mouth. This information is not intended to replace advice given to you by your health care provider. Make sure you discuss any questions you have with your health care provider. Document Released: 08/03/2004 Document Revised: 08/02/2016 Document Reviewed: 08/02/2016 Elsevier Interactive Patient Education  2017 Elsevier Inc.     IF you received an x-ray today, you will receive an invoice from [facility]. Please contact [facility] at [phone] with questions or concerns regarding your invoice.   IF you received labwork today, you will receive an invoice from [facility]. Please contact [facility] at [phone] with questions or  concerns regarding your invoice.   Our billing staff will not be able to assist you with questions regarding bills from these companies.  You will be contacted with the lab results as soon as they are available. The fastest way to get your results is to activate your My Chart account. Instructions are located on the last page of this paperwork. If you have not heard from us regarding the results in 2 weeks, please contact this office.      

## 2018-06-21 LAB — COMPREHENSIVE METABOLIC PANEL
ALBUMIN: 4.2 g/dL (ref 3.6–4.8)
ALK PHOS: 72 IU/L (ref 39–117)
ALT: 32 IU/L (ref 0–44)
AST: 37 IU/L (ref 0–40)
Albumin/Globulin Ratio: 1.5 (ref 1.2–2.2)
BILIRUBIN TOTAL: 1.1 mg/dL (ref 0.0–1.2)
BUN / CREAT RATIO: 18 (ref 10–24)
BUN: 16 mg/dL (ref 8–27)
CO2: 22 mmol/L (ref 20–29)
CREATININE: 0.87 mg/dL (ref 0.76–1.27)
Calcium: 9.1 mg/dL (ref 8.6–10.2)
Chloride: 104 mmol/L (ref 96–106)
GFR calc Af Amer: 105 mL/min/{1.73_m2} (ref >59)
GFR calc non Af Amer: 91 mL/min/{1.73_m2} (ref >59)
GLOBULIN, TOTAL: 2.8 g/dL (ref 1.5–4.5)
Glucose: 154 mg/dL — ABNORMAL HIGH (ref 65–99)
POTASSIUM: 3.9 mmol/L (ref 3.5–5.2)
Sodium: 142 mmol/L (ref 134–144)
Total Protein: 7 g/dL (ref 6.0–8.5)

## 2018-06-21 LAB — LIPID PANEL
CHOLESTEROL TOTAL: 137 mg/dL (ref 100–199)
Chol/HDL Ratio: 4.2 ratio (ref 0.0–5.0)
HDL: 33 mg/dL — ABNORMAL LOW (ref >39)
LDL CALC: 79 mg/dL (ref 0–99)
Triglycerides: 125 mg/dL (ref 0–149)
VLDL Cholesterol Cal: 25 mg/dL (ref 5–40)

## 2018-06-21 LAB — HEMOGLOBIN A1C
ESTIMATED AVERAGE GLUCOSE: 134 mg/dL
Hgb A1c MFr Bld: 6.3 % — ABNORMAL HIGH (ref 4.8–5.6)

## 2018-07-01 ENCOUNTER — Encounter: Payer: Self-pay | Admitting: Urgent Care

## 2018-07-27 ENCOUNTER — Telehealth: Payer: Self-pay | Admitting: General Practice

## 2018-07-27 NOTE — Telephone Encounter (Signed)
Called pt. to reschedule their appt. with [facility] [address] [facility]. Left VM to call back.

## 2018-12-05 ENCOUNTER — Other Ambulatory Visit: Payer: Self-pay | Admitting: Urgent Care

## 2018-12-08 NOTE — Telephone Encounter (Signed)
Please Advise Pt needs OV for additional refills.

## 2018-12-09 ENCOUNTER — Other Ambulatory Visit: Payer: Self-pay | Admitting: Urgent Care

## 2018-12-11 NOTE — Telephone Encounter (Signed)
Pt needs to establish care with new provider

## 2018-12-19 ENCOUNTER — Ambulatory Visit: Payer: Self-pay | Admitting: Urgent Care

## 2019-02-22 ENCOUNTER — Other Ambulatory Visit: Payer: Self-pay | Admitting: Family Medicine

## 2019-02-22 NOTE — Telephone Encounter (Signed)
Needs appt. Called and left message with son for pt to call back to make an appointment. Pt <3 months overdue: a 30 day courtesy refill given Requested Prescriptions  Pending Prescriptions Disp Refills  . lisinopril-hydrochlorothiazide (PRINZIDE,ZESTORETIC) 20-25 MG tablet [Pharmacy Med Name: LISINOP/HCTZ20-25MG  TAB] 15 tablet 0    Sig: Take 1/2 (one-half) tablet by mouth once daily     Cardiovascular:  ACEI + Diuretic Combos Failed - 02/22/2019  3:05 PM      Failed - Na in normal range and within 180 days    Sodium  Date Value Ref Range Status  06/20/2018 142 134 - 144 mmol/L Final         Failed - K in normal range and within 180 days    Potassium  Date Value Ref Range Status  06/20/2018 3.9 3.5 - 5.2 mmol/L Final         Failed - Cr in normal range and within 180 days    Creat  Date Value Ref Range Status  05/12/2016 0.78 0.70 - 1.25 mg/dL Final    Comment:      For patients > or = 66 years of age: The upper reference limit for Creatinine is approximately 13% higher for people identified as African-American.      Creatinine, Ser  Date Value Ref Range Status  06/20/2018 0.87 0.76 - 1.27 mg/dL Final         Failed - Ca in normal range and within 180 days    Calcium  Date Value Ref Range Status  06/20/2018 9.1 8.6 - 10.2 mg/dL Final         Failed - Valid encounter within last 6 months    Recent Outpatient Visits          8 months ago Essential hypertension   Primary Care at [facility], [place], [place]   1 year ago Essential hypertension   Primary Care at [facility], Eilleen Kempf, [place]   2 years ago Essential hypertension   Primary Care at [facility], Harrel Lemon, [place]   4 years ago Secondary hypertension, unspecified   Primary Care at [facility], Ashley Jacobs, MD             Passed - Patient is not pregnant      Passed - Last BP in normal range    BP Readings from Last 1 Encounters:  06/20/18 120/75

## 2019-09-28 ENCOUNTER — Telehealth: Payer: Self-pay | Admitting: Emergency Medicine

## 2019-09-28 NOTE — Telephone Encounter (Signed)
Copied from CRM #[id number]. Topic: General - Inquiry >> Sep 28, 2019  1:38 PM Youngblood, Natasha wrote: Reason for CRM: Please contact patient back in regards to a medication refill 

## 2019-09-28 NOTE — Telephone Encounter (Signed)
Spoke with patient and gave him number to contact pcp office to schedule appointment for medication refills

## 2019-09-28 NOTE — Telephone Encounter (Signed)
Copied from [place] [id number]. Topic: General - Inquiry >> Sep 28, 2019  1:38 PM Alease Frame wrote: Reason for CRM: Please contact patient back in regards to a medication refill

## 2019-10-08 ENCOUNTER — Other Ambulatory Visit: Payer: Self-pay | Admitting: Family Medicine

## 2019-10-10 ENCOUNTER — Ambulatory Visit (INDEPENDENT_AMBULATORY_CARE_PROVIDER_SITE_OTHER): Payer: Self-pay | Admitting: Emergency Medicine

## 2019-10-10 ENCOUNTER — Encounter: Payer: Self-pay | Admitting: Emergency Medicine

## 2019-10-10 ENCOUNTER — Other Ambulatory Visit: Payer: Self-pay

## 2019-10-10 VITALS — BP 142/89 | HR 76 | Temp 97.9°F | Resp 16 | Ht 67.0 in | Wt 207.2 lb

## 2019-10-10 DIAGNOSIS — R7303 Prediabetes: Secondary | ICD-10-CM | POA: Insufficient documentation

## 2019-10-10 DIAGNOSIS — I10 Essential (primary) hypertension: Secondary | ICD-10-CM

## 2019-10-10 DIAGNOSIS — E785 Hyperlipidemia, unspecified: Secondary | ICD-10-CM

## 2019-10-10 MED ORDER — ATORVASTATIN CALCIUM 20 MG PO TABS: 20.0000 mg | ORAL_TABLET | Freq: Every day | ORAL | 3 refills | Status: DC

## 2019-10-10 MED ORDER — LISINOPRIL-HYDROCHLOROTHIAZIDE 10-12.5 MG PO TABS: 1.0000 | ORAL_TABLET | Freq: Every day | ORAL | 3 refills | Status: DC

## 2019-10-10 MED ORDER — LISINOPRIL-HYDROCHLOROTHIAZIDE 20-25 MG PO TABS: 1.0000 | ORAL_TABLET | Freq: Every day | ORAL | 3 refills | Status: DC

## 2019-10-10 NOTE — Assessment & Plan Note (Signed)
Well-controlled.  Continue present medications.  No changes.  Follow-up in 6 months.

## 2019-10-10 NOTE — Addendum Note (Signed)
Addended by: Davina Poke on: 10/10/2019 02:22 PM   Modules accepted: Orders

## 2019-10-10 NOTE — Progress Notes (Addendum)
Omar Barker 68 y.o.   Chief Complaint  Patient presents with  . Medication Refill    Atorvastatin and Lisino-hctz    HISTORY OF PRESENT ILLNESS: This is a 68 y.o. male with history of hypertension and dyslipidemia here for follow-up and medication refill.  Presently taking Zestoretic 10-12.5 mg and atorvastatin 20 mg daily. BP Readings from Last 3 Encounters:  10/10/19 (!) 142/89  06/20/18 120/75  05/31/17 (!) 149/81  Doing well, has no complaints or medical concerns today.   HPI   Prior to Admission medications   Medication Sig Start Date End Date Taking? Authorizing Provider  atorvastatin (LIPITOR) 20 MG tablet TAKE 1 TABLET BY MOUTH ONCE DAILY 12/11/18  Yes Collie Siad A, MD  lisinopril-hydrochlorothiazide (PRINZIDE,ZESTORETIC) 20-25 MG tablet Take 1/2 (one-half) tablet by mouth once daily 02/22/19  Yes Doristine Bosworth, MD    No Known Allergies  Patient Active Problem List   Diagnosis Date Noted  . Dyslipidemia 05/31/2017  . HTN (hypertension) 11/25/2014    Past Medical History:  Diagnosis Date  . Anxiety   . Hypertension     History reviewed. No pertinent surgical history.  Social History   Socioeconomic History  . Marital status: Married    Spouse name: Not on file  . Number of children: Not on file  . Years of education: Not on file  . Highest education level: Not on file  Occupational History  . Not on file  Social Needs  . Financial resource strain: Not on file  . Food insecurity    Worry: Not on file    Inability: Not on file  . Transportation needs    Medical: Not on file    Non-medical: Not on file  Tobacco Use  . Smoking status: Never Smoker  . Smokeless tobacco: Never Used  Substance and Sexual Activity  . Alcohol use: No    Alcohol/week: 0.0 standard drinks  . Drug use: No  . Sexual activity: Not on file  Lifestyle  . Physical activity    Days per week: Not on file    Minutes per session: Not on file  . Stress: Not on file   Relationships  . Social Musician on phone: Not on file    Gets together: Not on file    Attends religious service: Not on file    Active member of club or organization: Not on file    Attends meetings of clubs or organizations: Not on file    Relationship status: Not on file  . Intimate partner violence    Fear of current or ex partner: Not on file    Emotionally abused: Not on file    Physically abused: Not on file    Forced sexual activity: Not on file  Other Topics Concern  . Not on file  Social History Narrative  . Not on file    History reviewed. No pertinent family history.   Review of Systems  Constitutional: Negative.  Negative for fever.  HENT: Negative.  Negative for congestion and sore throat.   Respiratory: Negative.  Negative for sputum production.   Cardiovascular: Negative.  Negative for chest pain and palpitations.  Gastrointestinal: Negative.  Negative for abdominal pain, nausea and vomiting.  Genitourinary: Negative.   Musculoskeletal: Negative.  Negative for myalgias.  Skin: Negative.  Negative for rash.  Neurological: Negative.  Negative for dizziness and headaches.  All other systems reviewed and are negative.  Vitals:   10/10/19 1340  BP: Marland Kitchen)  142/89  Pulse: 76  Resp: 16  Temp: 97.9 F (36.6 C)  SpO2: 97%     Physical Exam Vitals signs reviewed.  Constitutional:      Appearance: Normal appearance.  HENT:     Head: Normocephalic.  Eyes:     Extraocular Movements: Extraocular movements intact.     Conjunctiva/sclera: Conjunctivae normal.     Pupils: Pupils are equal, round, and reactive to light.  Neck:     Musculoskeletal: Normal range of motion and neck supple.  Cardiovascular:     Rate and Rhythm: Normal rate and regular rhythm.     Pulses: Normal pulses.     Heart sounds: Normal heart sounds.  Pulmonary:     Effort: Pulmonary effort is normal.     Breath sounds: Normal breath sounds.  Musculoskeletal: Normal range of  motion.  Skin:    General: Skin is warm and dry.     Capillary Refill: Capillary refill takes less than 2 seconds.  Neurological:     General: No focal deficit present.     Mental Status: He is alert and oriented to person, place, and time.  Psychiatric:        Mood and Affect: Mood normal.        Behavior: Behavior normal.      ASSESSMENT & PLAN: HTN (hypertension) Well-controlled.  Continue present medications.  No changes.  Follow-up in 6 months.  Dyslipidemia Lipid profile done today.  Continue atorvastatin.  Follow-up in 6 months.  Omar was seen today for medication refill.  Diagnoses and all orders for this visit:  Essential hypertension -     Discontinue: lisinopril-hydrochlorothiazide (ZESTORETIC) 20-25 MG tablet; Take 1 tablet by mouth daily. -     Comprehensive metabolic panel -     Discontinue: lisinopril-hydrochlorothiazide (ZESTORETIC) 20-25 MG tablet; Take 1 tablet by mouth daily.  Dyslipidemia -     Discontinue: atorvastatin (LIPITOR) 20 MG tablet; Take 1 tablet (20 mg total) by mouth daily. -     Lipid panel -     atorvastatin (LIPITOR) 20 MG tablet; Take 1 tablet (20 mg total) by mouth daily.  Prediabetes -     Hemoglobin A1c  Other orders -     lisinopril-hydrochlorothiazide (ZESTORETIC) 10-12.5 MG tablet; Take 1 tablet by mouth daily.     Patient Instructions       If you have lab work done today you will be contacted with your lab results within the next 2 weeks.  If you have not heard from [place] then please contact us. The fastest way to get your results is to register for My Chart.   IF you received an x-ray today, you will receive an invoice from [facility]. Please contact [facility] at [phone] with questions or concerns regarding your invoice.   IF you received labwork today, you will receive an invoice from [facility]. Please contact [facility] at [phone] with questions or concerns regarding your invoice.    Our billing staff will not be able to assist you with questions regarding bills from these companies.  You will be contacted with the lab results as soon as they are available. The fastest way to get your results is to activate your My Chart account. Instructions are located on the last page of this paperwork. If you have not heard from [place] regarding the results in 2 weeks, please contact this office.     Hypertension, Adult High blood pressure (hypertension) is when the force of blood pumping through  the arteries is too strong. The arteries are the blood vessels that carry blood from the heart throughout the body. Hypertension forces the heart to work harder to pump blood and may cause arteries to become narrow or stiff. Untreated or uncontrolled hypertension can cause a heart attack, heart failure, a stroke, kidney disease, and other problems. A blood pressure reading consists of a higher number over a lower number. Ideally, your blood pressure should be below 120/80. The first ("top") number is called the systolic pressure. It is a measure of the pressure in your arteries as your heart beats. The second ("bottom") number is called the diastolic pressure. It is a measure of the pressure in your arteries as the heart relaxes. What are the causes? The exact cause of this condition is not known. There are some conditions that result in or are related to high blood pressure. What increases the risk? Some risk factors for high blood pressure are under your control. The following factors may make you more likely to develop this condition:  Smoking.  Having type 2 diabetes mellitus, high cholesterol, or both.  Not getting enough exercise or physical activity.  Being overweight.  Having too much fat, sugar, calories, or salt (sodium) in your diet.  Drinking too much alcohol. Some risk factors for high blood pressure may be difficult or impossible to change. Some of these factors include:  Having  chronic kidney disease.  Having a family history of high blood pressure.  Age. Risk increases with age.  Race. You may be at higher risk if you are African American.  Gender. Men are at higher risk than women before age 66. After age 66, women are at higher risk than men.  Having obstructive sleep apnea.  Stress. What are the signs or symptoms? High blood pressure may not cause symptoms. Very high blood pressure (hypertensive crisis) may cause:  Headache.  Anxiety.  Shortness of breath.  Nosebleed.  Nausea and vomiting.  Vision changes.  Severe chest pain.  Seizures. How is this diagnosed? This condition is diagnosed by measuring your blood pressure while you are seated, with your arm resting on a flat surface, your legs uncrossed, and your feet flat on the floor. The cuff of the blood pressure monitor will be placed directly against the skin of your upper arm at the level of your heart. It should be measured at least twice using the same arm. Certain conditions can cause a difference in blood pressure between your right and left arms. Certain factors can cause blood pressure readings to be lower or higher than normal for a short period of time:  When your blood pressure is higher when you are in a health care provider's office than when you are at home, this is called white coat hypertension. Most people with this condition do not need medicines.  When your blood pressure is higher at home than when you are in a health care provider's office, this is called masked hypertension. Most people with this condition may need medicines to control blood pressure. If you have a high blood pressure reading during one visit or you have normal blood pressure with other risk factors, you may be asked to:  Return on a different day to have your blood pressure checked again.  Monitor your blood pressure at home for 1 week or longer. If you are diagnosed with hypertension, you may have  other blood or imaging tests to help your health care provider understand your overall risk for  other conditions. How is this treated? This condition is treated by making healthy lifestyle changes, such as eating healthy foods, exercising more, and reducing your alcohol intake. Your health care provider may prescribe medicine if lifestyle changes are not enough to get your blood pressure under control, and if:  Your systolic blood pressure is above 130.  Your diastolic blood pressure is above 80. Your personal target blood pressure may vary depending on your medical conditions, your age, and other factors. Follow these instructions at home: Eating and drinking   Eat a diet that is high in fiber and potassium, and low in sodium, added sugar, and fat. An example eating plan is called the DASH (Dietary Approaches to Stop Hypertension) diet. To eat this way: ? Eat plenty of fresh fruits and vegetables. Try to fill one half of your plate at each meal with fruits and vegetables. ? Eat whole grains, such as whole-wheat pasta, brown rice, or whole-grain bread. Fill about one fourth of your plate with whole grains. ? Eat or drink low-fat dairy products, such as skim milk or low-fat yogurt. ? Avoid fatty cuts of meat, processed or cured meats, and poultry with skin. Fill about one fourth of your plate with lean proteins, such as fish, chicken without skin, beans, eggs, or tofu. ? Avoid pre-made and processed foods. These tend to be higher in sodium, added sugar, and fat.  Reduce your daily sodium intake. Most people with hypertension should eat less than 1,500 mg of sodium a day.  Do not drink alcohol if: ? Your health care provider tells you not to drink. ? You are pregnant, may be pregnant, or are planning to become pregnant.  If you drink alcohol: ? Limit how much you use to:  0-1 drink a day for women.  0-2 drinks a day for men. ? Be aware of how much alcohol is in your drink. In the [place]  one drink equals one 12 oz bottle of beer (355 mL), one 5 oz glass of wine (148 mL), or one 1 oz glass of hard liquor (44 mL). Lifestyle   Work with your health care provider to maintain a healthy body weight or to lose weight. Ask what an ideal weight is for you.  Get at least 30 minutes of exercise most days of the week. Activities may include walking, swimming, or biking.  Include exercise to strengthen your muscles (resistance exercise), such as Pilates or lifting weights, as part of your weekly exercise routine. Try to do these types of exercises for 30 minutes at least 3 days a week.  Do not use any products that contain nicotine or tobacco, such as cigarettes, e-cigarettes, and chewing tobacco. If you need help quitting, ask your health care provider.  Monitor your blood pressure at home as told by your health care provider.  Keep all follow-up visits as told by your health care provider. This is important. Medicines  Take over-the-counter and prescription medicines only as told by your health care provider. Follow directions carefully. Blood pressure medicines must be taken as prescribed.  Do not skip doses of blood pressure medicine. Doing this puts you at risk for problems and can make the medicine less effective.  Ask your health care provider about side effects or reactions to medicines that you should watch for. Contact a health care provider if you:  Think you are having a reaction to a medicine you are taking.  Have headaches that keep coming back (recurring).  Feel dizzy.  Have swelling in your ankles.  Have trouble with your vision. Get help right away if you:  Develop a severe headache or confusion.  Have unusual weakness or numbness.  Feel faint.  Have severe pain in your chest or abdomen.  Vomit repeatedly.  Have trouble breathing. Summary  Hypertension is when the force of blood pumping through your arteries is too strong. If this condition is not  controlled, it may put you at risk for serious complications.  Your personal target blood pressure may vary depending on your medical conditions, your age, and other factors. For most people, a normal blood pressure is less than 120/80.  Hypertension is treated with lifestyle changes, medicines, or a combination of both. Lifestyle changes include losing weight, eating a healthy, low-sodium diet, exercising more, and limiting alcohol. This information is not intended to replace advice given to you by your health care provider. Make sure you discuss any questions you have with your health care provider. Document Released: 11/08/2005 Document Revised: 07/19/2018 Document Reviewed: 07/19/2018 Elsevier Patient Education  2020 Elsevier Inc.      Edwina Barth, MD [facility]

## 2019-10-10 NOTE — Assessment & Plan Note (Signed)
Lipid profile done today.  Continue atorvastatin.  Follow-up in 6 months.

## 2019-10-10 NOTE — Patient Instructions (Addendum)
   If you have lab work done today you will be contacted with your lab results within the next 2 weeks.  If you have not heard from us then please contact us. The fastest way to get your results is to register for My Chart.   IF you received an x-ray today, you will receive an invoice from [facility]. Please contact [facility] at [phone] with questions or concerns regarding your invoice.   IF you received labwork today, you will receive an invoice from [facility]. Please contact [facility] at [phone] with questions or concerns regarding your invoice.   Our billing staff will not be able to assist you with questions regarding bills from these companies.  You will be contacted with the lab results as soon as they are available. The fastest way to get your results is to activate your My Chart account. Instructions are located on the last page of this paperwork. If you have not heard from us regarding the results in 2 weeks, please contact this office.     Hypertension, Adult High blood pressure (hypertension) is when the force of blood pumping through the arteries is too strong. The arteries are the blood vessels that carry blood from the heart throughout the body. Hypertension forces the heart to work harder to pump blood and may cause arteries to become narrow or stiff. Untreated or uncontrolled hypertension can cause a heart attack, heart failure, a stroke, kidney disease, and other problems. A blood pressure reading consists of a higher number over a lower number. Ideally, your blood pressure should be below 120/80. The first ("top") number is called the systolic pressure. It is a measure of the pressure in your arteries as your heart beats. The second ("bottom") number is called the diastolic pressure. It is a measure of the pressure in your arteries as the heart relaxes. What are the causes? The exact cause of this condition is not known. There are some conditions  that result in or are related to high blood pressure. What increases the risk? Some risk factors for high blood pressure are under your control. The following factors may make you more likely to develop this condition:  Smoking.  Having type 2 diabetes mellitus, high cholesterol, or both.  Not getting enough exercise or physical activity.  Being overweight.  Having too much fat, sugar, calories, or salt (sodium) in your diet.  Drinking too much alcohol. Some risk factors for high blood pressure may be difficult or impossible to change. Some of these factors include:  Having chronic kidney disease.  Having a family history of high blood pressure.  Age. Risk increases with age.  Race. You may be at higher risk if you are African American.  Gender. Men are at higher risk than women before age 45. After age 65, women are at higher risk than men.  Having obstructive sleep apnea.  Stress. What are the signs or symptoms? High blood pressure may not cause symptoms. Very high blood pressure (hypertensive crisis) may cause:  Headache.  Anxiety.  Shortness of breath.  Nosebleed.  Nausea and vomiting.  Vision changes.  Severe chest pain.  Seizures. How is this diagnosed? This condition is diagnosed by measuring your blood pressure while you are seated, with your arm resting on a flat surface, your legs uncrossed, and your feet flat on the floor. The cuff of the blood pressure monitor will be placed directly against the skin of your upper arm at the level of your heart.   It should be measured at least twice using the same arm. Certain conditions can cause a difference in blood pressure between your right and left arms. Certain factors can cause blood pressure readings to be lower or higher than normal for a short period of time:  When your blood pressure is higher when you are in a health care provider's office than when you are at home, this is called white coat hypertension.  Most people with this condition do not need medicines.  When your blood pressure is higher at home than when you are in a health care provider's office, this is called masked hypertension. Most people with this condition may need medicines to control blood pressure. If you have a high blood pressure reading during one visit or you have normal blood pressure with other risk factors, you may be asked to:  Return on a different day to have your blood pressure checked again.  Monitor your blood pressure at home for 1 week or longer. If you are diagnosed with hypertension, you may have other blood or imaging tests to help your health care provider understand your overall risk for other conditions. How is this treated? This condition is treated by making healthy lifestyle changes, such as eating healthy foods, exercising more, and reducing your alcohol intake. Your health care provider may prescribe medicine if lifestyle changes are not enough to get your blood pressure under control, and if:  Your systolic blood pressure is above 130.  Your diastolic blood pressure is above 80. Your personal target blood pressure may vary depending on your medical conditions, your age, and other factors. Follow these instructions at home: Eating and drinking   Eat a diet that is high in fiber and potassium, and low in sodium, added sugar, and fat. An example eating plan is called the DASH (Dietary Approaches to Stop Hypertension) diet. To eat this way: ? Eat plenty of fresh fruits and vegetables. Try to fill one half of your plate at each meal with fruits and vegetables. ? Eat whole grains, such as whole-wheat pasta, brown rice, or whole-grain bread. Fill about one fourth of your plate with whole grains. ? Eat or drink low-fat dairy products, such as skim milk or low-fat yogurt. ? Avoid fatty cuts of meat, processed or cured meats, and poultry with skin. Fill about one fourth of your plate with lean proteins, such  as fish, chicken without skin, beans, eggs, or tofu. ? Avoid pre-made and processed foods. These tend to be higher in sodium, added sugar, and fat.  Reduce your daily sodium intake. Most people with hypertension should eat less than 1,500 mg of sodium a day.  Do not drink alcohol if: ? Your health care provider tells you not to drink. ? You are pregnant, may be pregnant, or are planning to become pregnant.  If you drink alcohol: ? Limit how much you use to:  0-1 drink a day for women.  0-2 drinks a day for men. ? Be aware of how much alcohol is in your drink. In the [place] one drink equals one 12 oz bottle of beer (355 mL), one 5 oz glass of wine (148 mL), or one 1 oz glass of hard liquor (44 mL). Lifestyle   Work with your health care provider to maintain a healthy body weight or to lose weight. Ask what an ideal weight is for you.  Get at least 30 minutes of exercise most days of the week. Activities may include walking, swimming, or   biking.  Include exercise to strengthen your muscles (resistance exercise), such as Pilates or lifting weights, as part of your weekly exercise routine. Try to do these types of exercises for 30 minutes at least 3 days a week.  Do not use any products that contain nicotine or tobacco, such as cigarettes, e-cigarettes, and chewing tobacco. If you need help quitting, ask your health care provider.  Monitor your blood pressure at home as told by your health care provider.  Keep all follow-up visits as told by your health care provider. This is important. Medicines  Take over-the-counter and prescription medicines only as told by your health care provider. Follow directions carefully. Blood pressure medicines must be taken as prescribed.  Do not skip doses of blood pressure medicine. Doing this puts you at risk for problems and can make the medicine less effective.  Ask your health care provider about side effects or reactions to medicines that you  should watch for. Contact a health care provider if you:  Think you are having a reaction to a medicine you are taking.  Have headaches that keep coming back (recurring).  Feel dizzy.  Have swelling in your ankles.  Have trouble with your vision. Get help right away if you:  Develop a severe headache or confusion.  Have unusual weakness or numbness.  Feel faint.  Have severe pain in your chest or abdomen.  Vomit repeatedly.  Have trouble breathing. Summary  Hypertension is when the force of blood pumping through your arteries is too strong. If this condition is not controlled, it may put you at risk for serious complications.  Your personal target blood pressure may vary depending on your medical conditions, your age, and other factors. For most people, a normal blood pressure is less than 120/80.  Hypertension is treated with lifestyle changes, medicines, or a combination of both. Lifestyle changes include losing weight, eating a healthy, low-sodium diet, exercising more, and limiting alcohol. This information is not intended to replace advice given to you by your health care provider. Make sure you discuss any questions you have with your health care provider. Document Released: 11/08/2005 Document Revised: 07/19/2018 Document Reviewed: 07/19/2018 Elsevier Patient Education  2020 [employer]  

## 2019-10-11 ENCOUNTER — Encounter: Payer: Self-pay | Admitting: Emergency Medicine

## 2019-10-11 LAB — COMPREHENSIVE METABOLIC PANEL
ALT: 30 IU/L (ref 0–44)
AST: 25 IU/L (ref 0–40)
Albumin/Globulin Ratio: 1.7 (ref 1.2–2.2)
Albumin: 4.4 g/dL (ref 3.8–4.8)
Alkaline Phosphatase: 79 IU/L (ref 39–117)
BUN/Creatinine Ratio: 10 (ref 10–24)
BUN: 9 mg/dL (ref 8–27)
Bilirubin Total: 0.8 mg/dL (ref 0.0–1.2)
CO2: 24 mmol/L (ref 20–29)
Calcium: 9.7 mg/dL (ref 8.6–10.2)
Chloride: 100 mmol/L (ref 96–106)
Creatinine, Ser: 0.87 mg/dL (ref 0.76–1.27)
GFR calc Af Amer: 104 mL/min/{1.73_m2} (ref >59)
GFR calc non Af Amer: 90 mL/min/{1.73_m2} (ref >59)
Globulin, Total: 2.6 g/dL (ref 1.5–4.5)
Glucose: 123 mg/dL — ABNORMAL HIGH (ref 65–99)
Potassium: 4.1 mmol/L (ref 3.5–5.2)
Sodium: 140 mmol/L (ref 134–144)
Total Protein: 7 g/dL (ref 6.0–8.5)

## 2019-10-11 LAB — LIPID PANEL
Chol/HDL Ratio: 6.9 ratio — ABNORMAL HIGH (ref 0.0–5.0)
Cholesterol, Total: 215 mg/dL — ABNORMAL HIGH (ref 100–199)
HDL: 31 mg/dL — ABNORMAL LOW (ref >39)
LDL Chol Calc (NIH): 134 mg/dL — ABNORMAL HIGH (ref 0–99)
Triglycerides: 278 mg/dL — ABNORMAL HIGH (ref 0–149)
VLDL Cholesterol Cal: 50 mg/dL — ABNORMAL HIGH (ref 5–40)

## 2019-10-11 LAB — HEMOGLOBIN A1C
Est. average glucose Bld gHb Est-mCnc: 134 mg/dL
Hgb A1c MFr Bld: 6.3 % — ABNORMAL HIGH (ref 4.8–5.6)

## 2019-10-11 NOTE — Telephone Encounter (Signed)
Rx was resent yesterday at appointment.

## 2020-04-09 ENCOUNTER — Ambulatory Visit: Payer: Self-pay | Admitting: Emergency Medicine

## 2020-04-10 ENCOUNTER — Encounter: Payer: Self-pay | Admitting: Emergency Medicine

## 2020-10-12 ENCOUNTER — Other Ambulatory Visit: Payer: Self-pay | Admitting: Emergency Medicine

## 2020-10-12 DIAGNOSIS — I10 Essential (primary) hypertension: Secondary | ICD-10-CM

## 2020-10-12 DIAGNOSIS — E785 Hyperlipidemia, unspecified: Secondary | ICD-10-CM

## 2020-10-12 NOTE — Telephone Encounter (Signed)
Requested medication (s) are due for refill today:  Yes  Requested medication (s) are on the active medication list:  Yes  Future visit scheduled:  No  Last Refill:10/10/19; #90; RF x 3 for both Lisinopril HCTZ, and Atorvastatin  Notes to clinic: pt. was to f/u in 03/2020; due to protocol, unable to refill since pt. Is >3 mos. Past recommended f/u.  Requested Prescriptions  Pending Prescriptions Disp Refills   lisinopril-hydrochlorothiazide (ZESTORETIC) 20-25 MG tablet [Pharmacy Med Name: LISINOPRIL-HCTZ 20-25 MG TAB] 90 tablet 3    Sig: TAKE 1 TABLET BY MOUTH EVERY DAY      Cardiovascular:  ACEI + Diuretic Combos Failed - 10/12/2020  5:01 PM      Failed - Na in normal range and within 180 days    Sodium  Date Value Ref Range Status  10/10/2019 140 134 - 144 mmol/L Final          Failed - K in normal range and within 180 days    Potassium  Date Value Ref Range Status  10/10/2019 4.1 3.5 - 5.2 mmol/L Final          Failed - Cr in normal range and within 180 days    Creat  Date Value Ref Range Status  05/12/2016 0.78 0.70 - 1.25 mg/dL Final    Comment:      For patients > or = 67 years of age: The upper reference limit for Creatinine is approximately 13% higher for people identified as African-American.      Creatinine, Ser  Date Value Ref Range Status  10/10/2019 0.87 0.76 - 1.27 mg/dL Final          Failed - Ca in normal range and within 180 days    Calcium  Date Value Ref Range Status  10/10/2019 9.7 8.6 - 10.2 mg/dL Final          Failed - Last BP in normal range    BP Readings from Last 1 Encounters:  10/10/19 (!) 142/89          Failed - Valid encounter within last 6 months    Recent Outpatient Visits           1 year ago Essential hypertension   [facility] [place], [place], [place]   2 years ago Essential hypertension   Primary Care at [facility], [place], [place]   3 years ago Essential hypertension   [facility] [place],  [place], [place]   4 years ago Essential hypertension   [facility] at [facility], Harrel Lemon, MD   5 years ago Secondary hypertension, unspecified   Primary Care at [facility], Ashley Jacobs, MD              Passed - Patient is not pregnant        atorvastatin (LIPITOR) 20 MG tablet [Pharmacy Med Name: ATORVASTATIN 20 MG TABLET] 90 tablet 2    Sig: TAKE 1 TABLET BY MOUTH EVERY DAY      Cardiovascular:  Antilipid - Statins Failed - 10/12/2020  5:01 PM      Failed - Total Cholesterol in normal range and within 360 days    Cholesterol, Total  Date Value Ref Range Status  10/10/2019 215 (H) 100 - 199 mg/dL Final          Failed - LDL in normal range and within 360 days    LDL Chol Calc (NIH)  Date Value Ref Range Status  10/10/2019  134 (H) 0 - 99 mg/dL Final          Failed - HDL in normal range and within 360 days    HDL  Date Value Ref Range Status  10/10/2019 31 (L) >39 mg/dL Final          Failed - Triglycerides in normal range and within 360 days    Triglycerides  Date Value Ref Range Status  10/10/2019 278 (H) 0 - 149 mg/dL Final          Failed - Valid encounter within last 12 months    Recent Outpatient Visits           1 year ago Essential hypertension   [facility] at [place], Eilleen Kempf, MD   2 years ago Essential hypertension   Primary Care at [facility], [place], [place]   3 years ago Essential hypertension   [facility] at [place], Eilleen Kempf, MD   4 years ago Essential hypertension   [facility] at [place], Harrel Lemon, MD   5 years ago Secondary hypertension, unspecified   Primary Care at [facility], Ashley Jacobs, MD              Passed - Patient is not pregnant

## 2020-10-13 NOTE — Telephone Encounter (Signed)
Pt will need an OV prior to med refill. He has not been seen in over a year.  Thanks, [employer]

## 2020-10-15 NOTE — Telephone Encounter (Signed)
Called pt and sch appt for 11/29 °

## 2020-10-20 ENCOUNTER — Other Ambulatory Visit: Payer: Self-pay

## 2020-10-20 ENCOUNTER — Encounter: Payer: Self-pay | Admitting: Emergency Medicine

## 2020-10-20 ENCOUNTER — Ambulatory Visit (INDEPENDENT_AMBULATORY_CARE_PROVIDER_SITE_OTHER): Payer: Self-pay | Admitting: Emergency Medicine

## 2020-10-20 VITALS — BP 131/82 | HR 65 | Temp 97.6°F | Resp 16 | Ht 67.0 in | Wt 216.0 lb

## 2020-10-20 DIAGNOSIS — E785 Hyperlipidemia, unspecified: Secondary | ICD-10-CM

## 2020-10-20 DIAGNOSIS — Z6833 Body mass index (BMI) 33.0-33.9, adult: Secondary | ICD-10-CM

## 2020-10-20 DIAGNOSIS — R7303 Prediabetes: Secondary | ICD-10-CM

## 2020-10-20 DIAGNOSIS — I10 Essential (primary) hypertension: Secondary | ICD-10-CM

## 2020-10-20 DIAGNOSIS — Z1211 Encounter for screening for malignant neoplasm of colon: Secondary | ICD-10-CM

## 2020-10-20 MED ORDER — ATORVASTATIN CALCIUM 20 MG PO TABS: 20.0000 mg | ORAL_TABLET | Freq: Every day | ORAL | 3 refills | Status: DC

## 2020-10-20 MED ORDER — LISINOPRIL-HYDROCHLOROTHIAZIDE 20-25 MG PO TABS: 1.0000 | ORAL_TABLET | Freq: Every day | ORAL | 3 refills | Status: DC

## 2020-10-20 NOTE — Assessment & Plan Note (Signed)
Well-controlled hypertension.  Continue present medication.  No changes. Diet and nutrition discussed. Follow-up in 6 to 12 months.

## 2020-10-20 NOTE — Progress Notes (Signed)
Omar Barker 69 y.o.   Chief Complaint  Patient presents with   Medication Refill    Atorvastatin and Lisino/Hctz    HISTORY OF PRESENT ILLNESS: This is a 69 y.o. male with history of hypertension and dyslipidemia here for follow-up and medication refill. Presently taking Zestoretic 20-25 and atorvastatin 20 mg daily. Has no complaints or medical concerns today. Fully vaccinated against Covid.  HPI   Prior to Admission medications   Medication Sig Start Date End Date Taking? Authorizing Provider  atorvastatin (LIPITOR) 20 MG tablet Take 1 tablet (20 mg total) by mouth daily. 10/20/20  Yes Macaria Bias, Eilleen KempfMiguel Jose, MD  lisinopril-hydrochlorothiazide (ZESTORETIC) 20-25 MG tablet Take 1 tablet by mouth daily. 10/20/20  Yes Georgina QuintSagardia, Junice Fei Jose, MD    No Known Allergies  Patient Active Problem List   Diagnosis Date Noted   Prediabetes 10/10/2019   Dyslipidemia 05/31/2017   HTN (hypertension) 11/25/2014    Past Medical History:  Diagnosis Date   Anxiety    Hypertension     History reviewed. No pertinent surgical history.  Social History   Socioeconomic History   Marital status: Married    Spouse name: Not on file   Number of children: Not on file   Years of education: Not on file   Highest education level: Not on file  Occupational History   Not on file  Tobacco Use   Smoking status: Never Smoker   Smokeless tobacco: Never Used  Substance and Sexual Activity   Alcohol use: No    Alcohol/week: 0.0 standard drinks   Drug use: No   Sexual activity: Not on file  Other Topics Concern   Not on file  Social History Narrative   Not on file   Social Determinants of Health   Financial Resource Strain:    Difficulty of Paying Living Expenses: Not on file  Food Insecurity:    Worried About Running Out of Food in the Last Year: Not on file   Ran Out of Food in the Last Year: Not on file  Transportation Needs:    Lack of Transportation  (Medical): Not on file   Lack of Transportation (Non-Medical): Not on file  Physical Activity:    Days of Exercise per Week: Not on file   Minutes of Exercise per Session: Not on file  Stress:    Feeling of Stress : Not on file  Social Connections:    Frequency of Communication with Friends and Family: Not on file   Frequency of Social Gatherings with Friends and Family: Not on file   Attends Religious Services: Not on file   Active Member of Clubs or Organizations: Not on file   Attends BankerClub or Organization Meetings: Not on file   Marital Status: Not on file  Intimate Partner Violence:    Fear of Current or Ex-Partner: Not on file   Emotionally Abused: Not on file   Physically Abused: Not on file   Sexually Abused: Not on file    History reviewed. No pertinent family history.   Review of Systems  Constitutional: Negative.  Negative for chills and fever.  HENT: Negative.  Negative for congestion and sore throat.   Respiratory: Negative.  Negative for cough and shortness of breath.   Cardiovascular: Negative.  Negative for chest pain and palpitations.  Gastrointestinal: Negative.  Negative for abdominal pain, blood in stool, diarrhea, melena, nausea and vomiting.  Genitourinary: Negative.  Negative for dysuria and hematuria.  Skin: Negative.  Negative for rash.  Neurological:  Negative.  Negative for dizziness and headaches.  All other systems reviewed and are negative.  Today's Vitals   10/20/20 1451  BP: 131/82  Pulse: 65  Resp: 16  Temp: 97.6 F (36.4 C)  TempSrc: Temporal  SpO2: 97%  Weight: 216 lb (98 kg)  Height: 5\' 7"  (1.702 m)   Body mass index is 33.83 kg/m.   Physical Exam Vitals reviewed.  Constitutional:      Appearance: Normal appearance.  HENT:     Head: Normocephalic.  Eyes:     Extraocular Movements: Extraocular movements intact.     Conjunctiva/sclera: Conjunctivae normal.     Pupils: Pupils are equal, round, and reactive to  light.  Neck:     Vascular: No carotid bruit.  Cardiovascular:     Rate and Rhythm: Normal rate and regular rhythm.     Pulses: Normal pulses.     Heart sounds: Normal heart sounds.  Pulmonary:     Effort: Pulmonary effort is normal.     Breath sounds: Normal breath sounds.  Musculoskeletal:        General: Normal range of motion.     Cervical back: Normal range of motion and neck supple. No tenderness.  Lymphadenopathy:     Cervical: No cervical adenopathy.  Skin:    General: Skin is warm and dry.     Capillary Refill: Capillary refill takes less than 2 seconds.  Neurological:     General: No focal deficit present.     Mental Status: He is alert and oriented to person, place, and time.  Psychiatric:        Mood and Affect: Mood normal.        Behavior: Behavior normal.    A total of 30 minutes was spent with the patient, greater than 50% of which was in counseling/coordination of care regarding hypertension and dyslipidemia and cardiovascular risks associated with these conditions, education on nutrition, review of most recent blood work results, review of most recent office visit notes, review of all medications, colon cancer screening and need for colonoscopy, prognosis, documentation, and need for follow-up.   ASSESSMENT & PLAN: HTN (hypertension) Well-controlled hypertension.  Continue present medication.  No changes. Diet and nutrition discussed. Follow-up in 6 to 12 months.  Omar was seen today for medication refill.  Diagnoses and all orders for this visit:  Essential hypertension -     Comprehensive metabolic panel -     lisinopril-hydrochlorothiazide (ZESTORETIC) 20-25 MG tablet; Take 1 tablet by mouth daily.  Prediabetes -     Hemoglobin A1c  Dyslipidemia -     Lipid panel -     atorvastatin (LIPITOR) 20 MG tablet; Take 1 tablet (20 mg total) by mouth daily.  Body mass index (BMI) of 33.0-33.9 in adult  Colon cancer screening -     Ambulatory  referral to Gastroenterology    Patient Instructions       If you have lab work done today you will be contacted with your lab results within the next 2 weeks.  If you have not heard from 05-02-1979 then please contact us. The fastest way to get your results is to register for My Chart.   IF you received an x-ray today, you will receive an invoice from [facility]. Please contact [facility] at [phone] with questions or concerns regarding your invoice.   IF you received labwork today, you will receive an invoice from [place]. Please contact [facility] at [phone] with questions or concerns regarding your  invoice.   Our billing staff will not be able to assist you with questions regarding bills from these companies.  You will be contacted with the lab results as soon as they are available. The fastest way to get your results is to activate your My Chart account. Instructions are located on the last page of this paperwork. If you have not heard from [place] regarding the results in 2 weeks, please contact this office.     Health Maintenance After Age 6 After age 45, you are at a higher risk for certain long-term diseases and infections as well as injuries from falls. Falls are a major cause of broken bones and head injuries in people who are older than age 26. Getting regular preventive care can help to keep you healthy and well. Preventive care includes getting regular testing and making lifestyle changes as recommended by your health care provider. Talk with your health care provider about:  Which screenings and tests you should have. A screening is a test that checks for a disease when you have no symptoms.  A diet and exercise plan that is right for you. What should I know about screenings and tests to prevent falls? Screening and testing are the best ways to find a health problem early. Early diagnosis and treatment give you the best chance of managing medical  conditions that are common after age 52. Certain conditions and lifestyle choices may make you more likely to have a fall. Your health care provider may recommend:  Regular vision checks. Poor vision and conditions such as cataracts can make you more likely to have a fall. If you wear glasses, make sure to get your prescription updated if your vision changes.  Medicine review. Work with your health care provider to regularly review all of the medicines you are taking, including over-the-counter medicines. Ask your health care provider about any side effects that may make you more likely to have a fall. Tell your health care provider if any medicines that you take make you feel dizzy or sleepy.  Osteoporosis screening. Osteoporosis is a condition that causes the bones to get weaker. This can make the bones weak and cause them to break more easily.  Blood pressure screening. Blood pressure changes and medicines to control blood pressure can make you feel dizzy.  Strength and balance checks. Your health care provider may recommend certain tests to check your strength and balance while standing, walking, or changing positions.  Foot health exam. Foot pain and numbness, as well as not wearing proper footwear, can make you more likely to have a fall.  Depression screening. You may be more likely to have a fall if you have a fear of falling, feel emotionally low, or feel unable to do activities that you used to do.  Alcohol use screening. Using too much alcohol can affect your balance and may make you more likely to have a fall. What actions can I take to lower my risk of falls? General instructions  Talk with your health care provider about your risks for falling. Tell your health care provider if: ? You fall. Be sure to tell your health care provider about all falls, even ones that seem minor. ? You feel dizzy, sleepy, or off-balance.  Take over-the-counter and prescription medicines only as told  by your health care provider. These include any supplements.  Eat a healthy diet and maintain a healthy weight. A healthy diet includes low-fat dairy products, low-fat (lean) meats, and fiber from  whole grains, beans, and lots of fruits and vegetables. Home safety  Remove any tripping hazards, such as rugs, cords, and clutter.  Install safety equipment such as grab bars in bathrooms and safety rails on stairs.  Keep rooms and walkways well-lit. Activity   Follow a regular exercise program to stay fit. This will help you maintain your balance. Ask your health care provider what types of exercise are appropriate for you.  If you need a cane or walker, use it as recommended by your health care provider.  Wear supportive shoes that have nonskid soles. Lifestyle  Do not drink alcohol if your health care provider tells you not to drink.  If you drink alcohol, limit how much you have: ? 0-1 drink a day for women. ? 0-2 drinks a day for men.  Be aware of how much alcohol is in your drink. In the [place] one drink equals one typical bottle of beer (12 oz), one-half glass of wine (5 oz), or one shot of hard liquor (1 oz).  Do not use any products that contain nicotine or tobacco, such as cigarettes and e-cigarettes. If you need help quitting, ask your health care provider. Summary  Having a healthy lifestyle and getting preventive care can help to protect your health and wellness after age 66.  Screening and testing are the best way to find a health problem early and help you avoid having a fall. Early diagnosis and treatment give you the best chance for managing medical conditions that are more common for people who are older than age 4.  Falls are a major cause of broken bones and head injuries in people who are older than age 53. Take precautions to prevent a fall at home.  Work with your health care provider to learn what changes you can make to improve your health and wellness and to  prevent falls. This information is not intended to replace advice given to you by your health care provider. Make sure you discuss any questions you have with your health care provider. Document Revised: 03/01/2019 Document Reviewed: 09/21/2017 Elsevier Patient Education  2020 Elsevier Inc.       Edwina Barth, MD [facility]

## 2020-10-20 NOTE — Patient Instructions (Addendum)
   If you have lab work done today you will be contacted with your lab results within the next 2 weeks.  If you have not heard from us then please contact us. The fastest way to get your results is to register for My Chart.   IF you received an x-ray today, you will receive an invoice from [facility]. Please contact [facility] at [phone] with questions or concerns regarding your invoice.   IF you received labwork today, you will receive an invoice from [facility]. Please contact [facility] at [phone] with questions or concerns regarding your invoice.   Our billing staff will not be able to assist you with questions regarding bills from these companies.  You will be contacted with the lab results as soon as they are available. The fastest way to get your results is to activate your My Chart account. Instructions are located on the last page of this paperwork. If you have not heard from us regarding the results in 2 weeks, please contact this office.     Health Maintenance After Age 67 After age 65, you are at a higher risk for certain long-term diseases and infections as well as injuries from falls. Falls are a major cause of broken bones and head injuries in people who are older than age 67. Getting regular preventive care can help to keep you healthy and well. Preventive care includes getting regular testing and making lifestyle changes as recommended by your health care provider. Talk with your health care provider about:  Which screenings and tests you should have. A screening is a test that checks for a disease when you have no symptoms.  A diet and exercise plan that is right for you. What should I know about screenings and tests to prevent falls? Screening and testing are the best ways to find a health problem early. Early diagnosis and treatment give you the best chance of managing medical conditions that are common after age 67. Certain conditions and  lifestyle choices may make you more likely to have a fall. Your health care provider may recommend:  Regular vision checks. Poor vision and conditions such as cataracts can make you more likely to have a fall. If you wear glasses, make sure to get your prescription updated if your vision changes.  Medicine review. Work with your health care provider to regularly review all of the medicines you are taking, including over-the-counter medicines. Ask your health care provider about any side effects that may make you more likely to have a fall. Tell your health care provider if any medicines that you take make you feel dizzy or sleepy.  Osteoporosis screening. Osteoporosis is a condition that causes the bones to get weaker. This can make the bones weak and cause them to break more easily.  Blood pressure screening. Blood pressure changes and medicines to control blood pressure can make you feel dizzy.  Strength and balance checks. Your health care provider may recommend certain tests to check your strength and balance while standing, walking, or changing positions.  Foot health exam. Foot pain and numbness, as well as not wearing proper footwear, can make you more likely to have a fall.  Depression screening. You may be more likely to have a fall if you have a fear of falling, feel emotionally low, or feel unable to do activities that you used to do.  Alcohol use screening. Using too much alcohol can affect your balance and may make you more likely to   have a fall. What actions can I take to lower my risk of falls? General instructions  Talk with your health care provider about your risks for falling. Tell your health care provider if: ? You fall. Be sure to tell your health care provider about all falls, even ones that seem minor. ? You feel dizzy, sleepy, or off-balance.  Take over-the-counter and prescription medicines only as told by your health care provider. These include any  supplements.  Eat a healthy diet and maintain a healthy weight. A healthy diet includes low-fat dairy products, low-fat (lean) meats, and fiber from whole grains, beans, and lots of fruits and vegetables. Home safety  Remove any tripping hazards, such as rugs, cords, and clutter.  Install safety equipment such as grab bars in bathrooms and safety rails on stairs.  Keep rooms and walkways well-lit. Activity   Follow a regular exercise program to stay fit. This will help you maintain your balance. Ask your health care provider what types of exercise are appropriate for you.  If you need a cane or walker, use it as recommended by your health care provider.  Wear supportive shoes that have nonskid soles. Lifestyle  Do not drink alcohol if your health care provider tells you not to drink.  If you drink alcohol, limit how much you have: ? 0-1 drink a day for women. ? 0-2 drinks a day for men.  Be aware of how much alcohol is in your drink. In the [place] one drink equals one typical bottle of beer (12 oz), one-half glass of wine (5 oz), or one shot of hard liquor (1 oz).  Do not use any products that contain nicotine or tobacco, such as cigarettes and e-cigarettes. If you need help quitting, ask your health care provider. Summary  Having a healthy lifestyle and getting preventive care can help to protect your health and wellness after age 67.  Screening and testing are the best way to find a health problem early and help you avoid having a fall. Early diagnosis and treatment give you the best chance for managing medical conditions that are more common for people who are older than age 67.  Falls are a major cause of broken bones and head injuries in people who are older than age 67. Take precautions to prevent a fall at home.  Work with your health care provider to learn what changes you can make to improve your health and wellness and to prevent falls. This information is not intended  to replace advice given to you by your health care provider. Make sure you discuss any questions you have with your health care provider. Document Revised: 03/01/2019 Document Reviewed: 09/21/2017 Elsevier Patient Education  2020 Elsevier Inc.  

## 2020-10-21 ENCOUNTER — Other Ambulatory Visit: Payer: Self-pay | Admitting: Emergency Medicine

## 2020-10-21 DIAGNOSIS — R7303 Prediabetes: Secondary | ICD-10-CM

## 2020-10-21 LAB — LIPID PANEL
Chol/HDL Ratio: 4.5 ratio (ref 0.0–5.0)
Cholesterol, Total: 141 mg/dL (ref 100–199)
HDL: 31 mg/dL — ABNORMAL LOW (ref >39)
LDL Chol Calc (NIH): 74 mg/dL (ref 0–99)
Triglycerides: 215 mg/dL — ABNORMAL HIGH (ref 0–149)
VLDL Cholesterol Cal: 36 mg/dL (ref 5–40)

## 2020-10-21 LAB — COMPREHENSIVE METABOLIC PANEL
ALT: 38 IU/L (ref 0–44)
AST: 33 IU/L (ref 0–40)
Albumin/Globulin Ratio: 1.4 (ref 1.2–2.2)
Albumin: 4.1 g/dL (ref 3.8–4.8)
Alkaline Phosphatase: 87 IU/L (ref 44–121)
BUN/Creatinine Ratio: 11 (ref 10–24)
BUN: 10 mg/dL (ref 8–27)
Bilirubin Total: 0.8 mg/dL (ref 0.0–1.2)
CO2: 27 mmol/L (ref 20–29)
Calcium: 9.4 mg/dL (ref 8.6–10.2)
Chloride: 100 mmol/L (ref 96–106)
Creatinine, Ser: 0.9 mg/dL (ref 0.76–1.27)
GFR calc Af Amer: 102 mL/min/{1.73_m2} (ref >59)
GFR calc non Af Amer: 88 mL/min/{1.73_m2} (ref >59)
Globulin, Total: 3 g/dL (ref 1.5–4.5)
Glucose: 93 mg/dL (ref 65–99)
Potassium: 3.8 mmol/L (ref 3.5–5.2)
Sodium: 140 mmol/L (ref 134–144)
Total Protein: 7.1 g/dL (ref 6.0–8.5)

## 2020-10-21 LAB — HEMOGLOBIN A1C
Est. average glucose Bld gHb Est-mCnc: 148 mg/dL
Hgb A1c MFr Bld: 6.8 % — ABNORMAL HIGH (ref 4.8–5.6)

## 2020-10-21 MED ORDER — METFORMIN HCL 500 MG PO TABS: 500.0000 mg | ORAL_TABLET | Freq: Two times a day (BID) | ORAL | 3 refills | Status: DC

## 2021-04-21 ENCOUNTER — Ambulatory Visit: Payer: Self-pay | Admitting: Emergency Medicine

## 2021-07-06 ENCOUNTER — Other Ambulatory Visit: Payer: Self-pay | Admitting: Emergency Medicine

## 2021-07-06 DIAGNOSIS — E785 Hyperlipidemia, unspecified: Secondary | ICD-10-CM

## 2021-11-25 ENCOUNTER — Other Ambulatory Visit: Payer: Self-pay | Admitting: Emergency Medicine

## 2021-11-25 DIAGNOSIS — I10 Essential (primary) hypertension: Secondary | ICD-10-CM

## 2022-05-17 ENCOUNTER — Encounter: Payer: Self-pay | Admitting: Emergency Medicine

## 2022-05-17 ENCOUNTER — Ambulatory Visit (INDEPENDENT_AMBULATORY_CARE_PROVIDER_SITE_OTHER): Payer: 59 | Admitting: Emergency Medicine

## 2022-05-17 VITALS — BP 132/78 | HR 64 | Temp 98.1°F | Ht 67.0 in | Wt 213.2 lb

## 2022-05-17 DIAGNOSIS — N5089 Other specified disorders of the male genital organs: Secondary | ICD-10-CM | POA: Diagnosis not present

## 2022-05-17 DIAGNOSIS — E1159 Type 2 diabetes mellitus with other circulatory complications: Secondary | ICD-10-CM | POA: Diagnosis not present

## 2022-05-17 DIAGNOSIS — I152 Hypertension secondary to endocrine disorders: Secondary | ICD-10-CM | POA: Diagnosis not present

## 2022-05-17 DIAGNOSIS — E1169 Type 2 diabetes mellitus with other specified complication: Secondary | ICD-10-CM

## 2022-05-17 DIAGNOSIS — Z1211 Encounter for screening for malignant neoplasm of colon: Secondary | ICD-10-CM

## 2022-05-17 DIAGNOSIS — E785 Hyperlipidemia, unspecified: Secondary | ICD-10-CM | POA: Diagnosis not present

## 2022-05-17 LAB — CBC WITH DIFFERENTIAL/PLATELET
Basophils Absolute: 0 10*3/uL (ref 0.0–0.1)
Basophils Relative: 0.6 % (ref 0.0–3.0)
Eosinophils Absolute: 0.3 10*3/uL (ref 0.0–0.7)
Eosinophils Relative: 4.2 % (ref 0.0–5.0)
HCT: 45.3 % (ref 39.0–52.0)
Hemoglobin: 14.6 g/dL (ref 13.0–17.0)
Lymphocytes Relative: 30.7 % (ref 12.0–46.0)
Lymphs Abs: 1.9 10*3/uL (ref 0.7–4.0)
MCHC: 32.2 g/dL (ref 30.0–36.0)
MCV: 79.7 fl (ref 78.0–100.0)
Monocytes Absolute: 0.5 10*3/uL (ref 0.1–1.0)
Monocytes Relative: 8.4 % (ref 3.0–12.0)
Neutro Abs: 3.5 10*3/uL (ref 1.4–7.7)
Neutrophils Relative %: 56.1 % (ref 43.0–77.0)
Platelets: 231 10*3/uL (ref 150.0–400.0)
RBC: 5.69 Mil/uL (ref 4.22–5.81)
RDW: 14.6 % (ref 11.5–15.5)
WBC: 6.2 10*3/uL (ref 4.0–10.5)

## 2022-05-17 LAB — LIPID PANEL
Cholesterol: 131 mg/dL (ref 0–200)
HDL: 34.1 mg/dL — ABNORMAL LOW (ref >39.00)
LDL Cholesterol: 79 mg/dL (ref 0–99)
NonHDL: 97.3
Total CHOL/HDL Ratio: 4
Triglycerides: 93 mg/dL (ref 0.0–149.0)
VLDL: 18.6 mg/dL (ref 0.0–40.0)

## 2022-05-17 LAB — URINALYSIS
Bilirubin Urine: NEGATIVE
Hgb urine dipstick: NEGATIVE
Ketones, ur: NEGATIVE
Leukocytes,Ua: NEGATIVE
Nitrite: NEGATIVE
Specific Gravity, Urine: 1.01 (ref 1.000–1.030)
Total Protein, Urine: NEGATIVE
Urine Glucose: NEGATIVE
Urobilinogen, UA: 0.2 (ref 0.0–1.0)
pH: 6.5 (ref 5.0–8.0)

## 2022-05-17 LAB — POCT GLYCOSYLATED HEMOGLOBIN (HGB A1C): Hemoglobin A1C: 7 % — AB (ref 4.0–5.6)

## 2022-05-17 LAB — MICROALBUMIN / CREATININE URINE RATIO
Creatinine,U: 77.4 mg/dL
Microalb Creat Ratio: 8 mg/g (ref 0.0–30.0)
Microalb, Ur: 6.2 mg/dL — ABNORMAL HIGH (ref 0.0–1.9)

## 2022-05-17 LAB — COMPREHENSIVE METABOLIC PANEL
ALT: 37 U/L (ref 0–53)
AST: 34 U/L (ref 0–37)
Albumin: 4.2 g/dL (ref 3.5–5.2)
Alkaline Phosphatase: 74 U/L (ref 39–117)
BUN: 13 mg/dL (ref 6–23)
CO2: 29 mEq/L (ref 19–32)
Calcium: 9.6 mg/dL (ref 8.4–10.5)
Chloride: 100 mEq/L (ref 96–112)
Creatinine, Ser: 0.89 mg/dL (ref 0.40–1.50)
GFR: 87.76 mL/min (ref >60.00)
Glucose, Bld: 106 mg/dL — ABNORMAL HIGH (ref 70–99)
Potassium: 4 mEq/L (ref 3.5–5.1)
Sodium: 137 mEq/L (ref 135–145)
Total Bilirubin: 1 mg/dL (ref 0.2–1.2)
Total Protein: 7.7 g/dL (ref 6.0–8.3)

## 2022-05-17 MED ORDER — METFORMIN HCL 500 MG PO TABS: 500.0000 mg | ORAL_TABLET | Freq: Two times a day (BID) | ORAL | 3 refills | Status: DC

## 2022-05-17 MED ORDER — ATORVASTATIN CALCIUM 20 MG PO TABS: 20.0000 mg | ORAL_TABLET | Freq: Every day | ORAL | 2 refills | Status: DC

## 2022-05-17 MED ORDER — RYBELSUS 7 MG PO TABS: 7.0000 mg | ORAL_TABLET | Freq: Every day | ORAL | 1 refills | Status: DC

## 2022-05-22 ENCOUNTER — Other Ambulatory Visit: Payer: Self-pay | Admitting: Emergency Medicine

## 2022-05-22 DIAGNOSIS — E1159 Type 2 diabetes mellitus with other circulatory complications: Secondary | ICD-10-CM

## 2022-05-23 ENCOUNTER — Other Ambulatory Visit: Payer: Self-pay | Admitting: Emergency Medicine

## 2022-05-23 MED ORDER — GLIPIZIDE 5 MG PO TABS: 5.0000 mg | ORAL_TABLET | Freq: Every day | ORAL | 3 refills | Status: DC

## 2022-06-07 ENCOUNTER — Encounter: Payer: Self-pay | Admitting: Emergency Medicine

## 2022-06-08 NOTE — Telephone Encounter (Signed)
How about ultrasound status?  Thanks.

## 2022-06-08 NOTE — Telephone Encounter (Signed)
Call patient and give him radiology department information so he can call to schedule the ultrasound.

## 2022-06-08 NOTE — Telephone Encounter (Signed)
Very small amount of protein in the urine.  No concerns.  Continue medications as prescribed and this will protect his kidneys and keep this from worsening.  Please look into testicular ultrasound.  Was it done, was it scheduled?  Thanks.

## 2022-06-08 NOTE — Telephone Encounter (Signed)
Referral was placed to urology. I have not seen where the referral has been assigned to an office. I am routing this message to our referral coordinators to inquire about referral status.

## 2022-06-24 ENCOUNTER — Ambulatory Visit
Admission: RE | Admit: 2022-06-24 | Discharge: 2022-06-24 | Disposition: A | Discharge status: Home / Self Care | Payer: Commercial Managed Care - HMO | Source: Ambulatory Visit | Attending: Emergency Medicine | Admitting: Emergency Medicine

## 2022-06-24 DIAGNOSIS — N5089 Other specified disorders of the male genital organs: Secondary | ICD-10-CM

## 2022-08-17 ENCOUNTER — Encounter: Payer: Self-pay | Admitting: Emergency Medicine

## 2022-08-17 ENCOUNTER — Ambulatory Visit (INDEPENDENT_AMBULATORY_CARE_PROVIDER_SITE_OTHER): Payer: Commercial Managed Care - HMO | Admitting: Emergency Medicine

## 2022-08-17 VITALS — BP 110/74 | HR 59 | Temp 98.1°F | Ht 67.0 in | Wt 207.2 lb

## 2022-08-17 DIAGNOSIS — R351 Nocturia: Secondary | ICD-10-CM | POA: Insufficient documentation

## 2022-08-17 DIAGNOSIS — N5089 Other specified disorders of the male genital organs: Secondary | ICD-10-CM

## 2022-08-17 DIAGNOSIS — I10 Essential (primary) hypertension: Secondary | ICD-10-CM

## 2022-08-17 DIAGNOSIS — I152 Hypertension secondary to endocrine disorders: Secondary | ICD-10-CM

## 2022-08-17 DIAGNOSIS — E1169 Type 2 diabetes mellitus with other specified complication: Secondary | ICD-10-CM | POA: Diagnosis not present

## 2022-08-17 DIAGNOSIS — E1159 Type 2 diabetes mellitus with other circulatory complications: Secondary | ICD-10-CM | POA: Diagnosis not present

## 2022-08-17 DIAGNOSIS — E785 Hyperlipidemia, unspecified: Secondary | ICD-10-CM

## 2022-08-17 LAB — POCT GLYCOSYLATED HEMOGLOBIN (HGB A1C): Hemoglobin A1C: 6.3 % — AB (ref 4.0–5.6)

## 2022-08-17 LAB — GLUCOSE, POCT (MANUAL RESULT ENTRY): POC Glucose: 148 mg/dl — AB (ref 70–99)

## 2022-08-17 MED ORDER — LISINOPRIL-HYDROCHLOROTHIAZIDE 20-25 MG PO TABS: 1.0000 | ORAL_TABLET | Freq: Every day | ORAL | 3 refills | Status: DC

## 2022-08-17 MED ORDER — ATORVASTATIN CALCIUM 20 MG PO TABS: 20.0000 mg | ORAL_TABLET | Freq: Every day | ORAL | 3 refills | Status: DC

## 2022-08-17 MED ORDER — METFORMIN HCL 500 MG PO TABS: 500.0000 mg | ORAL_TABLET | Freq: Two times a day (BID) | ORAL | 3 refills | Status: DC

## 2022-08-17 NOTE — Assessment & Plan Note (Signed)
Stable.  Continue atorvastatin 20 mg daily. Cardiovascular risk associated with hypertension diabetes and dyslipidemia discussed. Diet and nutrition discussed. The 10-year ASCVD risk score (Arnett DK, et al., 2019) is: 26.2%   Values used to calculate the score:     Age: 71 years     Sex: Male     Is Non-Hispanic African American: No     Diabetic: Yes     Tobacco smoker: No     Systolic Blood Pressure: 962 mmHg     Is BP treated: Yes     HDL Cholesterol: 34.1 mg/dL     Total Cholesterol: 131 mg/dL

## 2022-08-17 NOTE — Progress Notes (Signed)
Omar Barker 71 y.o.   Chief Complaint  Patient presents with   Follow-up    22mnth f/u appt ,urinary frequency 304 times at night     HISTORY OF PRESENT ILLNESS: This is a 71 y.o. male here for 31-month follow-up of diabetes and hypertension. Overall doing well. Also complaining of nocturia.  HPI   Prior to Admission medications   Medication Sig Start Date End Date Taking? Authorizing Provider  atorvastatin (LIPITOR) 20 MG tablet Take 1 tablet (20 mg total) by mouth daily. 08/17/22   Horald Pollen, MD  lisinopril-hydrochlorothiazide (ZESTORETIC) 20-25 MG tablet Take 1 tablet by mouth daily. 08/17/22   Horald Pollen, MD  metFORMIN (GLUCOPHAGE) 500 MG tablet Take 1 tablet (500 mg total) by mouth 2 (two) times daily with a meal. 08/17/22   Horald Pollen, MD    No Known Allergies  Patient Active Problem List   Diagnosis Date Noted   Dyslipidemia associated with type 2 diabetes mellitus ([place]) 05/17/2022   Testicular mass 05/17/2022   Prediabetes 10/10/2019   Dyslipidemia 05/31/2017   Hypertension associated with diabetes ([place]) 11/25/2014    Past Medical History:  Diagnosis Date   Anxiety    Hypertension     No past surgical history on file.  Social History   Socioeconomic History   Marital status: Married    Spouse name: Not on file   Number of children: Not on file   Years of education: Not on file   Highest education level: Not on file  Occupational History   Not on file  Tobacco Use   Smoking status: Never   Smokeless tobacco: Never  Substance and Sexual Activity   Alcohol use: No    Alcohol/week: 0.0 standard drinks of alcohol   Drug use: No   Sexual activity: Not on file  Other Topics Concern   Not on file  Social History Narrative   Not on file   Social Determinants of Health   Financial Resource Strain: Not on file  Food Insecurity: Not on file  Transportation Needs: Not on file  Physical Activity: Not on file  Stress:  Not on file  Social Connections: Not on file  Intimate Partner Violence: Not on file    No family history on file.   Review of Systems  Constitutional: Negative.  Negative for chills and fever.  HENT: Negative.  Negative for congestion and sore throat.   Respiratory: Negative.  Negative for cough and shortness of breath.   Cardiovascular: Negative.  Negative for chest pain and palpitations.  Gastrointestinal: Negative.  Negative for abdominal pain, nausea and vomiting.  Genitourinary:  Positive for frequency.       Nocturia  Musculoskeletal: Negative.   Skin: Negative.  Negative for rash.  Neurological: Negative.  Negative for dizziness and headaches.  All other systems reviewed and are negative.  Today's Vitals   08/17/22 1449  BP: 110/74  Pulse: (!) 59  Temp: 98.1 F (36.7 C)  TempSrc: Oral  SpO2: 98%  Weight: 207 lb 4 oz (94 kg)  Height: 5\' 7"  (1.702 m)   Body mass index is 32.46 kg/m. Wt Readings from Last 3 Encounters:  08/17/22 207 lb 4 oz (94 kg)  05/17/22 213 lb 4 oz (96.7 kg)  10/20/20 216 lb (98 kg)     Physical Exam Vitals reviewed.  Constitutional:      Appearance: Normal appearance.  HENT:     Head: Normocephalic.     Mouth/Throat:  Mouth: Mucous membranes are moist.     Pharynx: Oropharynx is clear.  Eyes:     Extraocular Movements: Extraocular movements intact.     Conjunctiva/sclera: Conjunctivae normal.     Pupils: Pupils are equal, round, and reactive to light.  Cardiovascular:     Rate and Rhythm: Normal rate and regular rhythm.     Pulses: Normal pulses.     Heart sounds: Normal heart sounds.  Pulmonary:     Effort: Pulmonary effort is normal.     Breath sounds: Normal breath sounds.  Abdominal:     Palpations: Abdomen is soft.     Tenderness: There is no abdominal tenderness.  Musculoskeletal:     Cervical back: No tenderness.  Lymphadenopathy:     Cervical: No cervical adenopathy.  Skin:    General: Skin is warm and dry.   Neurological:     General: No focal deficit present.     Mental Status: He is alert and oriented to person, place, and time.  Psychiatric:        Mood and Affect: Mood normal.        Behavior: Behavior normal.    Results for orders placed or performed in visit on 08/17/22 (from the past 24 hour(s))  POCT HgB A1C     Status: Abnormal   Collection Time: 08/17/22  3:15 PM  Result Value Ref Range   Hemoglobin A1C 6.3 (A) 4.0 - 5.6 %   HbA1c POC (<> result, manual entry)     HbA1c, POC (prediabetic range)     HbA1c, POC (controlled diabetic range)    POCT Glucose (CBG)     Status: Abnormal   Collection Time: 08/17/22  3:15 PM  Result Value Ref Range   POC Glucose 148 (A) 70 - 99 mg/dl     ASSESSMENT & PLAN: A total of 45 minutes was spent with the patient and counseling/coordination of care regarding preparing for this visit, review of most recent office visit notes, review of most recent blood work results including interpretation of today's hemoglobin A1c, review of multiple chronic medical problems and their management, review of all medications, education on nutrition, need for urologic evaluation of nocturia and lower urinary tract symptoms, prognosis, documentation, need for follow-up.  Problem List Items Addressed This Visit       Cardiovascular and Mediastinum   Hypertension associated with diabetes (HCC) - Primary    Well-controlled hypertension. Continue Zestoretic 20-25 mg tablet Patient has been taking half a tablet a day. BP Readings from Last 3 Encounters:  08/17/22 110/74  05/17/22 132/78  10/20/20 131/82  Well-controlled diabetes with hemoglobin A1c of 6.3 Continue metformin 500 mg twice a day.  Not taking glipizide Eating better and losing weight.       Relevant Medications   metFORMIN (GLUCOPHAGE) 500 MG tablet   lisinopril-hydrochlorothiazide (ZESTORETIC) 20-25 MG tablet   atorvastatin (LIPITOR) 20 MG tablet   Other Relevant Orders   POCT HgB A1C  (Completed)   POCT Glucose (CBG) (Completed)     Endocrine   Dyslipidemia associated with type 2 diabetes mellitus (HCC)    Stable.  Continue atorvastatin 20 mg daily. Cardiovascular risk associated with hypertension diabetes and dyslipidemia discussed. Diet and nutrition discussed. The 10-year ASCVD risk score (Arnett DK, et al., 2019) is: 26.2%   Values used to calculate the score:     Age: [age] years     Sex: Male     Is Non-Hispanic African American: No  Diabetic: Yes     Tobacco smoker: No     Systolic Blood Pressure: 110 mmHg     Is BP treated: Yes     HDL Cholesterol: 34.1 mg/dL     Total Cholesterol: 131 mg/dL       Relevant Medications   metFORMIN (GLUCOPHAGE) 500 MG tablet   lisinopril-hydrochlorothiazide (ZESTORETIC) 20-25 MG tablet   atorvastatin (LIPITOR) 20 MG tablet     Other   Dyslipidemia   Relevant Medications   atorvastatin (LIPITOR) 20 MG tablet   Testicular mass    Scrotal ultrasound report from last month reviewed with patient. Large hydrocele on right side.  Will need urological procedure. Has already been seen by urologist according to patient.      Nocturia    Has history of right-sided large hydrocele. Needs follow-up with urologist.      Other Visit Diagnoses     Essential hypertension       Relevant Medications   lisinopril-hydrochlorothiazide (ZESTORETIC) 20-25 MG tablet   atorvastatin (LIPITOR) 20 MG tablet      Patient Instructions  Diabetes Mellitus and Nutrition, Adult When you have diabetes, or diabetes mellitus, it is very important to have healthy eating habits because your blood sugar (glucose) levels are greatly affected by what you eat and drink. Eating healthy foods in the right amounts, at about the same times every day, can help you: Manage your blood glucose. Lower your risk of heart disease. Improve your blood pressure. Reach or maintain a healthy weight. What can affect my meal plan? Every person with diabetes  is different, and each person has different needs for a meal plan. Your health care provider may recommend that you work with a dietitian to make a meal plan that is best for you. Your meal plan may vary depending on factors such as: The calories you need. The medicines you take. Your weight. Your blood glucose, blood pressure, and cholesterol levels. Your activity level. Other health conditions you have, such as heart or kidney disease. How do carbohydrates affect me? Carbohydrates, also called carbs, affect your blood glucose level more than any other type of food. Eating carbs raises the amount of glucose in your blood. It is important to know how many carbs you can safely have in each meal. This is different for every person. Your dietitian can help you calculate how many carbs you should have at each meal and for each snack. How does alcohol affect me? Alcohol can cause a decrease in blood glucose (hypoglycemia), especially if you use insulin or take certain diabetes medicines by mouth. Hypoglycemia can be a life-threatening condition. Symptoms of hypoglycemia, such as sleepiness, dizziness, and confusion, are similar to symptoms of having too much alcohol. Do not drink alcohol if: Your health care provider tells you not to drink. You are pregnant, may be pregnant, or are planning to become pregnant. If you drink alcohol: Limit how much you have to: 0-1 drink a day for women. 0-2 drinks a day for men. Know how much alcohol is in your drink. In the [place] one drink equals one 12 oz bottle of beer (355 mL), one 5 oz glass of wine (148 mL), or one 1 oz glass of hard liquor (44 mL). Keep yourself hydrated with water, diet soda, or unsweetened iced tea. Keep in mind that regular soda, juice, and other mixers may contain a lot of sugar and must be counted as carbs. What are tips for following this plan?  Reading food  labels Start by checking the serving size on the Nutrition Facts label of  packaged foods and drinks. The number of calories and the amount of carbs, fats, and other nutrients listed on the label are based on one serving of the item. Many items contain more than one serving per package. Check the total grams (g) of carbs in one serving. Check the number of grams of saturated fats and trans fats in one serving. Choose foods that have a low amount or none of these fats. Check the number of milligrams (mg) of salt (sodium) in one serving. Most people should limit total sodium intake to less than 2,300 mg per day. Always check the nutrition information of foods labeled as "low-fat" or "nonfat." These foods may be higher in added sugar or refined carbs and should be avoided. Talk to your dietitian to identify your daily goals for nutrients listed on the label. Shopping Avoid buying canned, pre-made, or processed foods. These foods tend to be high in fat, sodium, and added sugar. Shop around the outside edge of the grocery store. This is where you will most often find fresh fruits and vegetables, bulk grains, fresh meats, and fresh dairy products. Cooking Use low-heat cooking methods, such as baking, instead of high-heat cooking methods, such as deep frying. Cook using healthy oils, such as olive, canola, or sunflower oil. Avoid cooking with butter, cream, or high-fat meats. Meal planning Eat meals and snacks regularly, preferably at the same times every day. Avoid going long periods of time without eating. Eat foods that are high in fiber, such as fresh fruits, vegetables, beans, and whole grains. Eat 4-6 oz (112-168 g) of lean protein each day, such as lean meat, chicken, fish, eggs, or tofu. One ounce (oz) (28 g) of lean protein is equal to: 1 oz (28 g) of meat, chicken, or fish. 1 egg.  cup (62 g) of tofu. Eat some foods each day that contain healthy fats, such as avocado, nuts, seeds, and fish. What foods should I eat? Fruits Berries. Apples. Oranges. Peaches.  Apricots. Plums. Grapes. Mangoes. Papayas. Pomegranates. Kiwi. Cherries. Vegetables Leafy greens, including lettuce, spinach, kale, chard, collard greens, mustard greens, and cabbage. Beets. Cauliflower. Broccoli. Carrots. Green beans. Tomatoes. Peppers. Onions. Cucumbers. Brussels sprouts. Grains Whole grains, such as whole-wheat or whole-grain bread, crackers, tortillas, cereal, and pasta. Unsweetened oatmeal. Quinoa. Brown or wild rice. Meats and other proteins Seafood. Poultry without skin. Lean cuts of poultry and beef. Tofu. Nuts. Seeds. Dairy Low-fat or fat-free dairy products such as milk, yogurt, and cheese. The items listed above may not be a complete list of foods and beverages you can eat and drink. Contact a dietitian for more information. What foods should I avoid? Fruits Fruits canned with syrup. Vegetables Canned vegetables. Frozen vegetables with butter or cream sauce. Grains Refined white flour and flour products such as bread, pasta, snack foods, and cereals. Avoid all processed foods. Meats and other proteins Fatty cuts of meat. Poultry with skin. Breaded or fried meats. Processed meat. Avoid saturated fats. Dairy Full-fat yogurt, cheese, or milk. Beverages Sweetened drinks, such as soda or iced tea. The items listed above may not be a complete list of foods and beverages you should avoid. Contact a dietitian for more information. Questions to ask a health care provider Do I need to meet with a certified diabetes care and education specialist? Do I need to meet with a dietitian? What number can I call if I have questions? When are the best  times to check my blood glucose? Where to find more information: American Diabetes Association: diabetes.org Academy of Nutrition and Dietetics: eatright.[employer] of Diabetes and Digestive and Kidney Diseases: StageSync.si Association of Diabetes Care & Education Specialists: diabeteseducator.org Summary It is  important to have healthy eating habits because your blood sugar (glucose) levels are greatly affected by what you eat and drink. It is important to use alcohol carefully. A healthy meal plan will help you manage your blood glucose and lower your risk of heart disease. Your health care provider may recommend that you work with a dietitian to make a meal plan that is best for you. This information is not intended to replace advice given to you by your health care provider. Make sure you discuss any questions you have with your health care provider. Document Revised: 06/11/2020 Document Reviewed: 06/11/2020 Elsevier Patient Education  2023 Elsevier Inc.     Edwina Barth, MD [facility]

## 2022-08-17 NOTE — Patient Instructions (Signed)

## 2022-08-17 NOTE — Assessment & Plan Note (Signed)
Well-controlled hypertension. Continue Zestoretic 20-25 mg tablet Patient has been taking half a tablet a day. BP Readings from Last 3 Encounters:  08/17/22 110/74  05/17/22 132/78  10/20/20 131/82  Well-controlled diabetes with hemoglobin A1c of 6.3 Continue metformin 500 mg twice a day.  Not taking glipizide Eating better and losing weight.

## 2022-08-17 NOTE — Assessment & Plan Note (Signed)
Has history of right-sided large hydrocele. Needs follow-up with urologist.

## 2022-08-17 NOTE — Assessment & Plan Note (Signed)
Scrotal ultrasound report from last month reviewed with patient. Large hydrocele on right side.  Will need urological procedure. Has already been seen by urologist according to patient.

## 2022-10-04 ENCOUNTER — Ambulatory Visit (INDEPENDENT_AMBULATORY_CARE_PROVIDER_SITE_OTHER): Payer: Commercial Managed Care - HMO | Admitting: Emergency Medicine

## 2022-10-04 ENCOUNTER — Encounter: Payer: Self-pay | Admitting: Emergency Medicine

## 2022-10-04 VITALS — BP 120/84 | HR 67 | Temp 98.1°F | Ht 67.0 in | Wt 204.4 lb

## 2022-10-04 DIAGNOSIS — E1169 Type 2 diabetes mellitus with other specified complication: Secondary | ICD-10-CM

## 2022-10-04 DIAGNOSIS — N433 Hydrocele, unspecified: Secondary | ICD-10-CM | POA: Diagnosis not present

## 2022-10-04 DIAGNOSIS — E1159 Type 2 diabetes mellitus with other circulatory complications: Secondary | ICD-10-CM | POA: Diagnosis not present

## 2022-10-04 DIAGNOSIS — Z1211 Encounter for screening for malignant neoplasm of colon: Secondary | ICD-10-CM

## 2022-10-04 DIAGNOSIS — E785 Hyperlipidemia, unspecified: Secondary | ICD-10-CM

## 2022-10-04 DIAGNOSIS — I152 Hypertension secondary to endocrine disorders: Secondary | ICD-10-CM

## 2022-10-04 MED ORDER — METFORMIN HCL 500 MG PO TABS: 500.0000 mg | ORAL_TABLET | Freq: Two times a day (BID) | ORAL | 3 refills | Status: DC

## 2022-10-04 MED ORDER — ATORVASTATIN CALCIUM 20 MG PO TABS: 20.0000 mg | ORAL_TABLET | Freq: Every day | ORAL | 3 refills | Status: DC

## 2022-10-04 MED ORDER — LISINOPRIL-HYDROCHLOROTHIAZIDE 10-12.5 MG PO TABS: 1.0000 | ORAL_TABLET | Freq: Every day | ORAL | 3 refills | Status: DC

## 2022-10-04 NOTE — Progress Notes (Signed)
Omar Barker 71 y.o.   Chief Complaint  Patient presents with   Follow-up    Patient states that the BP meds was making his BP drop. Patient states he is taking 10 mg now.   Patient has some testicles, swelling,     HISTORY OF PRESENT ILLNESS: This is a 71 y.o. male A1A with history of hypertension here for follow-up.  Zestoretic 20-25 dropping blood pressure too much and making him lightheaded and dizzy.  Started taking half a pill with good results.  Normal blood pressure readings at home. Has history of recurrent right hydrocele.  Recent scrotal ultrasound showed large hydrocele.  Has been evaluated by urologist twice in the recent past.  Recommended surgery.  States he is ready for surgery now.  Affecting quality of life.  No urinary symptoms. No other complaints or medical concerns today.  HPI   Prior to Admission medications   Medication Sig Start Date End Date Taking? Authorizing Provider  lisinopril-hydrochlorothiazide (ZESTORETIC) 10-12.5 MG tablet Take 1 tablet by mouth daily. 10/04/22  Yes Lenae Wherley, Eilleen Kempf, MD  atorvastatin (LIPITOR) 20 MG tablet Take 1 tablet (20 mg total) by mouth daily. 10/04/22   Georgina Quint, MD  metFORMIN (GLUCOPHAGE) 500 MG tablet Take 1 tablet (500 mg total) by mouth 2 (two) times daily with a meal. 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 No pertinent surgical history.  Social History   Socioeconomic History   Marital status: Married    Spouse name: Not on file   Number of children: Not on file    Years of education: Not on file   Highest education level: Not on file  Occupational History   Not on file  Tobacco Use   Smoking status: Never   Smokeless tobacco: Never  Substance and Sexual Activity   Alcohol use: No    Alcohol/week: 0.0 standard drinks of alcohol   Drug use: No   Sexual activity: Not on file  Other Topics Concern   Not on file  Social History Narrative   Not on file   Social Determinants of Health   Financial Resource Strain: Not on file  Food Insecurity: Not on file  Transportation Needs: Not on file  Physical Activity: Not on file  Stress: Not on file  Social Connections: Not on file  Intimate Partner Violence: Not on file    History reviewed. No pertinent family history.   Review of Systems  Constitutional: Negative.  Negative for chills and fever.  HENT: Negative.  Negative for congestion.   Respiratory: Negative.  Negative for cough and shortness of breath.   Cardiovascular: Negative.  Negative for chest pain and palpitations.  Gastrointestinal:  Negative for abdominal pain, nausea and vomiting.  Genitourinary: Negative.  Negative for dysuria and hematuria.  Skin: Negative.  Negative for rash.  Neurological: Negative.  Negative for dizziness and headaches.  All other systems reviewed and are negative.  Today's Vitals   10/04/22 1310  BP: 120/84  Pulse: 67  Temp: 98.1 F (36.7 C)  TempSrc:  Oral  SpO2: 98%  Weight: 204 lb 6 oz (92.7 kg)  Height: 5\' 7"  (1.702 m)   Body mass index is 32.01 kg/m.   Physical Exam Vitals reviewed.  Constitutional:      Appearance: Normal appearance.  HENT:     Head: Normocephalic.  Eyes:     Extraocular Movements: Extraocular movements intact.     Pupils: Pupils are equal, round, and reactive to light.  Cardiovascular:     Rate and Rhythm: Normal rate and regular rhythm.     Pulses: Normal pulses.     Heart sounds: Normal heart sounds.  Pulmonary:     Effort: Pulmonary effort is normal.      Breath sounds: Normal breath sounds.  Abdominal:     Palpations: Abdomen is soft.     Tenderness: There is no abdominal tenderness.  Genitourinary:    Comments: Large right-sided hydrocele Musculoskeletal:     Cervical back: No tenderness.  Lymphadenopathy:     Cervical: No cervical adenopathy.  Skin:    General: Skin is warm and dry.  Neurological:     General: No focal deficit present.     Mental Status: He is alert and oriented to person, place, and time.  Psychiatric:        Mood and Affect: Mood normal.        Behavior: Behavior normal.      ASSESSMENT & PLAN: A total of 43 minutes was spent with the patient and counseling/coordination of care regarding preparing for this visit, review of most recent office visit notes, review of most recent scrotal ultrasound report, review of most recent blood work results, review of multiple chronic medical conditions and their management, review of all medications and changes made, education on nutrition, cardiovascular risks associated with hypertension and dyslipidemia, prognosis, and need for follow-up  Problem List Items Addressed This Visit       Cardiovascular and Mediastinum   Hypertension associated with diabetes (HCC)    Was having hypotensive episodes with Zestoretic 20-25.  Has been taking half a tablet with normal blood pressure readings at home. Continue Zestoretic 10-12.5 mg daily.  Lower dose prescribed. Well-controlled diabetes.  Continue metformin 500 mg twice a day. Diet and nutrition discussed.      Relevant Medications   metFORMIN (GLUCOPHAGE) 500 MG tablet   lisinopril-hydrochlorothiazide (ZESTORETIC) 10-12.5 MG tablet   atorvastatin (LIPITOR) 20 MG tablet     Endocrine   Dyslipidemia associated with type 2 diabetes mellitus (HCC)    Stable.  Diet and nutrition discussed.  Continue atorvastatin 20 mg daily. The 10-year ASCVD risk score (Arnett DK, et al., 2019) is: 29.9%   Values used to calculate the  score:     Age: [age] years     Sex: Male     Is Non-Hispanic African American: No     Diabetic: Yes     Tobacco smoker: No     Systolic Blood Pressure: 120 mmHg     Is BP treated: Yes     HDL Cholesterol: 34.1 mg/dL     Total Cholesterol: 131 mg/dL       Relevant Medications   metFORMIN (GLUCOPHAGE) 500 MG tablet   lisinopril-hydrochlorothiazide (ZESTORETIC) 10-12.5 MG tablet   atorvastatin (LIPITOR) 20 MG tablet     Genitourinary   Right hydrocele - Primary    Large chronic hydrocele affecting quality of life. Has been evaluated by urology twice in the past.  Surgery recommended.  States he is ready for surgery  now. Advised to contact urology office to schedule surgery.        Other   Dyslipidemia   Relevant Medications   atorvastatin (LIPITOR) 20 MG tablet   Other Visit Diagnoses     Colon cancer screening       Relevant Orders   Cologuard        Patient Instructions  Hydrocele, Adult A hydrocele is a collection of fluid in the loose pouch of skin that holds the testicles (scrotum). It can occur in one or both testicles. This may happen because: The amount of fluid produced in the scrotum is not absorbed by the rest of the body. Fluid from the abdomen fills the scrotum. Normally, the testicles develop in the abdomen and then drop into the scrotum before birth. The tube that the testicles travel through usually closes after the testicles drop. If the tube does not close, fluid from the abdomen can fill the scrotum. This is not very common in adults. What are the causes? A hydrocele may be caused by: An injury to the scrotum. An infection. Decreased blood flow to the scrotum. Twisting of a testicle (testicular torsion). A birth defect. A tumor or cancer of the testicle. Sometimes, the cause is not known. What are the signs or symptoms? A hydrocele feels like a water-filled balloon. It may also feel heavy. Other symptoms include: Swelling of the scrotum. The  swelling may decrease when you lie down. You may also notice more swelling at night than in the morning. This is called a communicating hydrocele, in which the fluid in the scrotum goes back into the abdominal cavity when the position of the scrotum changes. Swelling of the groin. Mild discomfort in the scrotum. Pain. This can develop if the hydrocele was caused by infection or twisting. The larger the hydrocele, the more likely you are to have pain. Swelling may also cause pain. How is this diagnosed? This condition may be diagnosed based on a physical exam and your medical history. You may also have tests, including: Imaging tests, such as an ultrasound. A transillumination test. This test takes place in a dark room where a light is placed on the skin of the scrotum. Clear liquid will not impede the light and the scrotum will be illuminated. This helps a health care provider distinguish a hydrocele from a tumor. Blood or urine tests. How is this treated? Most hydroceles go away on their own. If you have no discomfort or pain, your health care provider may suggest close monitoring of your condition until the condition goes away or symptoms develop. This is called watch and wait or watchful waiting. If treatment is needed, it may include: Treating an underlying condition. This may include taking an antibiotic medicine to treat an infection. Having surgery to stop fluid from collecting in the scrotum. Having surgery to drain the fluid. Surgery may include: Hydrocelectomy. For this procedure, an incision is made in the scrotum to remove the fluid. Needle aspiration. A needle is used to drain fluid. However, the fluid buildup will come back quickly and may lead to an infection of the scrotum. This is rarely done. Follow these instructions at home: Medicines Take over-the-counter and prescription medicines only as told by your health care provider. If you were prescribed an antibiotic medicine, take  it as told by your health care provider. Do not stop taking the antibiotic even if you start to feel better. General instructions Watch the hydrocele for any changes. Keep all follow-up visits. This  is important. Contact a health care provider if: You notice any changes in the hydrocele. The swelling in your scrotum or groin gets worse. The hydrocele becomes red, firm, painful, or tender to the touch. You have a fever. Get help right away if you: Develop a lot of pain or your pain becomes worse. Have chills. Have a high fever. Summary A hydrocele is a collection of fluid in the loose pouch of skin that holds the testicles (scrotum). A hydrocele can cause swelling, discomfort, and pain. In adults, the cause of a hydrocele may not be known. However, it is sometimes caused by an infection or the twisting of a testicle. Hydroceles often go away on their own. If a hydrocele causes pain, treating the underlying cause may be needed to ease the pain. This information is not intended to replace advice given to you by your health care provider. Make sure you discuss any questions you have with your health care provider. Document Revised: 06/25/2021 Document Reviewed: 06/25/2021 Elsevier Patient Education  2023 Elsevier Inc.    Edwina Barth, MD [facility]

## 2022-10-04 NOTE — Patient Instructions (Signed)
Hydrocele, Adult A hydrocele is a collection of fluid in the loose pouch of skin that holds the testicles (scrotum). It can occur in one or both testicles. This may happen because: The amount of fluid produced in the scrotum is not absorbed by the rest of the body. Fluid from the abdomen fills the scrotum. Normally, the testicles develop in the abdomen and then drop into the scrotum before birth. The tube that the testicles travel through usually closes after the testicles drop. If the tube does not close, fluid from the abdomen can fill the scrotum. This is not very common in adults. What are the causes? A hydrocele may be caused by: An injury to the scrotum. An infection. Decreased blood flow to the scrotum. Twisting of a testicle (testicular torsion). A birth defect. A tumor or cancer of the testicle. Sometimes, the cause is not known. What are the signs or symptoms? A hydrocele feels like a water-filled balloon. It may also feel heavy. Other symptoms include: Swelling of the scrotum. The swelling may decrease when you lie down. You may also notice more swelling at night than in the morning. This is called a communicating hydrocele, in which the fluid in the scrotum goes back into the abdominal cavity when the position of the scrotum changes. Swelling of the groin. Mild discomfort in the scrotum. Pain. This can develop if the hydrocele was caused by infection or twisting. The larger the hydrocele, the more likely you are to have pain. Swelling may also cause pain. How is this diagnosed? This condition may be diagnosed based on a physical exam and your medical history. You may also have tests, including: Imaging tests, such as an ultrasound. A transillumination test. This test takes place in a dark room where a light is placed on the skin of the scrotum. Clear liquid will not impede the light and the scrotum will be illuminated. This helps a health care provider distinguish a hydrocele from a  tumor. Blood or urine tests. How is this treated? Most hydroceles go away on their own. If you have no discomfort or pain, your health care provider may suggest close monitoring of your condition until the condition goes away or symptoms develop. This is called watch and wait or watchful waiting. If treatment is needed, it may include: Treating an underlying condition. This may include taking an antibiotic medicine to treat an infection. Having surgery to stop fluid from collecting in the scrotum. Having surgery to drain the fluid. Surgery may include: Hydrocelectomy. For this procedure, an incision is made in the scrotum to remove the fluid. Needle aspiration. A needle is used to drain fluid. However, the fluid buildup will come back quickly and may lead to an infection of the scrotum. This is rarely done. Follow these instructions at home: Medicines Take over-the-counter and prescription medicines only as told by your health care provider. If you were prescribed an antibiotic medicine, take it as told by your health care provider. Do not stop taking the antibiotic even if you start to feel better. General instructions Watch the hydrocele for any changes. Keep all follow-up visits. This is important. Contact a health care provider if: You notice any changes in the hydrocele. The swelling in your scrotum or groin gets worse. The hydrocele becomes red, firm, painful, or tender to the touch. You have a fever. Get help right away if you: Develop a lot of pain or your pain becomes worse. Have chills. Have a high fever. Summary A hydrocele is   a collection of fluid in the loose pouch of skin that holds the testicles (scrotum). A hydrocele can cause swelling, discomfort, and pain. In adults, the cause of a hydrocele may not be known. However, it is sometimes caused by an infection or the twisting of a testicle. Hydroceles often go away on their own. If a hydrocele causes pain, treating the  underlying cause may be needed to ease the pain. This information is not intended to replace advice given to you by your health care provider. Make sure you discuss any questions you have with your health care provider. Document Revised: 06/25/2021 Document Reviewed: 06/25/2021 Elsevier Patient Education  2023 Elsevier Inc.  

## 2022-10-04 NOTE — Assessment & Plan Note (Signed)
Large chronic hydrocele affecting quality of life. Has been evaluated by urology twice in the past.  Surgery recommended.  States he is ready for surgery now. Advised to contact urology office to schedule surgery.

## 2022-10-04 NOTE — Assessment & Plan Note (Signed)
Stable.  Diet and nutrition discussed.  Continue atorvastatin 20 mg daily. The 10-year ASCVD risk score (Arnett DK, et al., 2019) is: 29.9%   Values used to calculate the score:     Age: 71 years     Sex: Male     Is Non-Hispanic African American: No     Diabetic: Yes     Tobacco smoker: No     Systolic Blood Pressure: 120 mmHg     Is BP treated: Yes     HDL Cholesterol: 34.1 mg/dL     Total Cholesterol: 131 mg/dL

## 2022-10-04 NOTE — Assessment & Plan Note (Signed)
Was having hypotensive episodes with Zestoretic 20-25.  Has been taking half a tablet with normal blood pressure readings at home. Continue Zestoretic 10-12.5 mg daily.  Lower dose prescribed. Well-controlled diabetes.  Continue metformin 500 mg twice a day. Diet and nutrition discussed.

## 2022-11-04 ENCOUNTER — Other Ambulatory Visit: Payer: Self-pay | Admitting: Urology

## 2022-11-05 ENCOUNTER — Encounter (HOSPITAL_COMMUNITY): Payer: Self-pay

## 2022-11-05 NOTE — Patient Instructions (Addendum)
SURGICAL WAITING ROOM VISITATION Patients having surgery or a procedure may have no more than 2 support people in the waiting area - these visitors may rotate.   Children under the age of 45 must have an adult with them who is not the patient. If the patient needs to stay at the hospital during part of their recovery, the visitor guidelines for inpatient rooms apply. Pre-op nurse will coordinate an appropriate time for 1 support person to accompany patient in pre-op.  This support person may not rotate.    Please refer to the [facility] website for the visitor guidelines for Inpatients (after your surgery is over and you are in a regular room).      Your procedure is scheduled on: 11-26-22   Report to [facility] Main Entrance    Report to admitting at 11:45 AM   Call this number if you have problems the morning of surgery [phone]   Do not eat food or drink liquids after :After Midnight.          If you have questions, please contact your surgeon's office.   FOLLOW  ANY ADDITIONAL PRE OP INSTRUCTIONS YOU RECEIVED FROM YOUR SURGEON'S OFFICE!!!     Oral Hygiene is also important to reduce your risk of infection.                                    Remember - BRUSH YOUR TEETH THE MORNING OF SURGERY WITH YOUR REGULAR TOOTHPASTE   Do NOT smoke after Midnight   Take these medicines the morning of surgery with A SIP OF WATER:   Atorvastatin  How to Manage Your Diabetes Before and After Surgery  Why is it important to control my blood sugar before and after surgery? Improving blood sugar levels before and after surgery helps healing and can limit problems. A way of improving blood sugar control is eating a healthy diet by:  Eating less sugar and carbohydrates  Increasing activity/exercise  Talking with your doctor about reaching your blood sugar goals High blood sugars (greater than 180 mg/dL) can raise your risk of infections and slow your recovery, so you will need to  focus on controlling your diabetes during the weeks before surgery. Make sure that the doctor who takes care of your diabetes knows about your planned surgery including the date and location.  How do I manage my blood sugar before surgery? Check your blood sugar at least 4 times a day, starting 2 days before surgery, to make sure that the level is not too high or low. Check your blood sugar the morning of your surgery when you wake up and every 2 hours until you get to the Short Stay unit. If your blood sugar is less than 70 mg/dL, you will need to treat for low blood sugar: Do not take insulin. Treat a low blood sugar (less than 70 mg/dL) with  cup of clear juice (cranberry or apple), 4 glucose tablets, OR glucose gel. Recheck blood sugar in 15 minutes after treatment (to make sure it is greater than 70 mg/dL). If your blood sugar is not greater than 70 mg/dL on recheck, call [phone] for further instructions. Report your blood sugar to the short stay nurse when you get to Short Stay.  If you are admitted to the hospital after surgery: Your blood sugar will be checked by the staff and you will probably be given insulin  after surgery (instead of oral diabetes medicines) to make sure you have good blood sugar levels. The goal for blood sugar control after surgery is 80-180 mg/dL.   WHAT DO I DO ABOUT MY DIABETES MEDICATION?  Do not take oral diabetes medicines (pills) the morning of surgery.  DO NOT TAKE THE FOLLOWING 7 DAYS PRIOR TO SURGERY: Ozempic, Wegovy, Rybelsus (Semaglutide), Byetta (exenatide), Bydureon (exenatide ER), Victoza, Saxenda (liraglutide), or Trulicity (dulaglutide) Mounjaro (Tirzepatide) Adlyxin (Lixisenatide), Polyethylene Glycol Loxenatide.   Reviewed and Endorsed by [facility] Patient Education Committee, August 2015  Bring CPAP mask and tubing day of surgery.                              You may not have any metal on your body including  jewelry, and body  piercing             Do not wear  lotions, powders, cologne, or deodorant              Men may shave face and neck.   Do not bring valuables to the hospital. [facility] IS NOT RESPONSIBLE   FOR VALUABLES.   Contacts, dentures or bridgework may not be worn into surgery.  DO NOT BRING YOUR HOME MEDICATIONS TO THE HOSPITAL. PHARMACY WILL DISPENSE MEDICATIONS LISTED ON YOUR MEDICATION LIST TO YOU DURING YOUR ADMISSION IN THE HOSPITAL!    Patients discharged on the day of surgery will not be allowed to drive home.  Someone NEEDS to stay with you for the first 24 hours after anesthesia.   Special Instructions: Bring a copy of your healthcare power of attorney and living will documents the day of surgery if you haven't scanned them before.              Please read over the following fact sheets you were given: IF YOU HAVE QUESTIONS ABOUT YOUR PRE-OP INSTRUCTIONS PLEASE CALL [phone] [facility]  If you received a COVID test during your pre-op visit  it is requested that you wear a mask when out in public, stay away from anyone that may not be feeling well and notify your surgeon if you develop symptoms. If you test positive for Covid or have been in contact with anyone that has tested positive in the last 10 days please notify you surgeon.  [place] [facility] - Preparing for Surgery Before surgery, you can play an important role.  Because skin is not sterile, your skin needs to be as free of germs as possible.  You can reduce the number of germs on your skin by washing with CHG (chlorahexidine gluconate) soap before surgery.  CHG is an antiseptic cleaner which kills germs and bonds with the skin to continue killing germs even after washing. Please DO NOT use if you have an allergy to CHG or antibacterial soaps.  If your skin becomes reddened/irritated stop using the CHG and inform your nurse when you arrive at Short Stay. Do not shave (including legs and underarms) for at least 48 hours prior to the first  CHG shower.  You may shave your face/neck.  Please follow these instructions carefully:  1.  Shower with CHG Soap the night before surgery and the  morning of surgery.  2.  If you choose to wash your hair, wash your hair first as usual with your normal  shampoo.  3.  After you shampoo, rinse your hair and body thoroughly to remove the shampoo.  4.  Use CHG as you would any other liquid soap.  You can apply chg directly to the skin and wash.  Gently with a scrungie or clean washcloth.  5.  Apply the CHG Soap to your body ONLY FROM THE NECK DOWN.   Do   not use on face/ open                           Wound or open sores. Avoid contact with eyes, ears mouth and   genitals (private parts).                       Wash face,  Genitals (private parts) with your normal soap.             6.  Wash thoroughly, paying special attention to the area where your    surgery  will be performed.  7.  Thoroughly rinse your body with warm water from the neck down.  8.  DO NOT shower/wash with your normal soap after using and rinsing off the CHG Soap.                9.  Pat yourself dry with a clean towel.            10.  Wear clean pajamas.            11.  Place clean sheets on your bed the night of your first shower and do not  sleep with pets. Day of Surgery : Do not apply any lotions/deodorants the morning of surgery.  Please wear clean clothes to the hospital/surgery center.  FAILURE TO FOLLOW THESE INSTRUCTIONS MAY RESULT IN THE CANCELLATION OF YOUR SURGERY  PATIENT SIGNATURE_________________________________  NURSE SIGNATURE__________________________________  ________________________________________________________________________

## 2022-11-09 ENCOUNTER — Encounter (HOSPITAL_COMMUNITY): Payer: Self-pay

## 2022-11-09 ENCOUNTER — Encounter (HOSPITAL_COMMUNITY)
Admission: RE | Admit: 2022-11-09 | Discharge: 2022-11-09 | Disposition: A | Discharge status: Home / Self Care | Payer: Commercial Managed Care - HMO | Source: Ambulatory Visit | Attending: Urology | Admitting: Urology

## 2022-11-09 ENCOUNTER — Other Ambulatory Visit: Payer: Self-pay

## 2022-11-09 VITALS — BP 135/94 | HR 62 | Temp 97.9°F | Resp 20 | Ht 67.0 in | Wt 203.8 lb

## 2022-11-09 DIAGNOSIS — I2510 Atherosclerotic heart disease of native coronary artery without angina pectoris: Secondary | ICD-10-CM | POA: Insufficient documentation

## 2022-11-09 DIAGNOSIS — Z01818 Encounter for other preprocedural examination: Secondary | ICD-10-CM | POA: Insufficient documentation

## 2022-11-09 DIAGNOSIS — E119 Type 2 diabetes mellitus without complications: Secondary | ICD-10-CM | POA: Insufficient documentation

## 2022-11-09 HISTORY — DX: Type 2 diabetes mellitus without complications: E11.9

## 2022-11-09 LAB — BASIC METABOLIC PANEL
Anion gap: 9 (ref 5–15)
BUN: 9 mg/dL (ref 8–23)
CO2: 26 mmol/L (ref 22–32)
Calcium: 9.2 mg/dL (ref 8.9–10.3)
Chloride: 106 mmol/L (ref 98–111)
Creatinine, Ser: 0.73 mg/dL (ref 0.61–1.24)
GFR, Estimated: >60 mL/min (ref >60)
Glucose, Bld: 111 mg/dL — ABNORMAL HIGH (ref 70–99)
Potassium: 3.6 mmol/L (ref 3.5–5.1)
Sodium: 141 mmol/L (ref 135–145)

## 2022-11-09 LAB — GLUCOSE, CAPILLARY: Glucose-Capillary: 101 mg/dL — ABNORMAL HIGH (ref 70–99)

## 2022-11-09 NOTE — Progress Notes (Signed)
COVID Vaccine Completed:  Yes x2  Date of COVID positive in last 90 days:  No  PCP - Victorino December, MD Cardiologist - N/A  Chest x-ray - N/A EKG - 11-09-22 Epic Stress Test - N/A ECHO - N/A Cardiac Cath - N/A Pacemaker/ICD device last checked: Spinal Cord Stimulator:  Bowel Prep - N/A  Sleep Study - N/A CPAP -   Fasting Blood Sugar - 120 range Checks Blood Sugar - checks occasionally  Last dose of GLP1 agonist-  N/A GLP1 instructions:  N/A   Last dose of SGLT-2 inhibitors-  N/A SGLT-2 instructions: N/A  Blood Thinner Instructions:N/A Aspirin Instructions: Last Dose:  Activity level:  Can go up a flight of stairs and perform activities of daily living without stopping and without symptoms of chest pain or shortness of breath.  Anesthesia review: N/A  Patient denies shortness of breath, fever, cough and chest pain at PAT appointment  Patient verbalized understanding of instructions that were given to them at the PAT appointment. Patient was also instructed that they will need to review over the PAT instructions again at home before surgery.

## 2022-11-11 LAB — HEMOGLOBIN A1C
Hgb A1c MFr Bld: 6.7 % — ABNORMAL HIGH (ref 4.8–5.6)
Mean Plasma Glucose: 146 mg/dL

## 2022-11-26 ENCOUNTER — Encounter (HOSPITAL_COMMUNITY)
Admission: RE | Disposition: A | Discharge status: Home / Self Care | Payer: Self-pay | Source: Ambulatory Visit | Attending: Urology

## 2022-11-26 ENCOUNTER — Ambulatory Visit (HOSPITAL_BASED_OUTPATIENT_CLINIC_OR_DEPARTMENT_OTHER): Payer: Commercial Managed Care - HMO | Admitting: Anesthesiology

## 2022-11-26 ENCOUNTER — Encounter (HOSPITAL_COMMUNITY): Payer: Self-pay | Admitting: Urology

## 2022-11-26 ENCOUNTER — Ambulatory Visit (HOSPITAL_COMMUNITY)
Admission: RE | Admit: 2022-11-26 | Discharge: 2022-11-26 | Disposition: A | Discharge status: Home / Self Care | Payer: Commercial Managed Care - HMO | Source: Ambulatory Visit | Attending: Urology | Admitting: Urology

## 2022-11-26 ENCOUNTER — Ambulatory Visit (HOSPITAL_COMMUNITY): Payer: Commercial Managed Care - HMO | Admitting: Anesthesiology

## 2022-11-26 ENCOUNTER — Other Ambulatory Visit: Payer: Self-pay

## 2022-11-26 DIAGNOSIS — N5089 Other specified disorders of the male genital organs: Secondary | ICD-10-CM | POA: Diagnosis present

## 2022-11-26 DIAGNOSIS — Z7984 Long term (current) use of oral hypoglycemic drugs: Secondary | ICD-10-CM | POA: Insufficient documentation

## 2022-11-26 DIAGNOSIS — E119 Type 2 diabetes mellitus without complications: Secondary | ICD-10-CM | POA: Diagnosis not present

## 2022-11-26 DIAGNOSIS — I10 Essential (primary) hypertension: Secondary | ICD-10-CM | POA: Diagnosis not present

## 2022-11-26 DIAGNOSIS — E669 Obesity, unspecified: Secondary | ICD-10-CM | POA: Diagnosis not present

## 2022-11-26 DIAGNOSIS — N433 Hydrocele, unspecified: Secondary | ICD-10-CM | POA: Insufficient documentation

## 2022-11-26 DIAGNOSIS — Z79899 Other long term (current) drug therapy: Secondary | ICD-10-CM | POA: Diagnosis not present

## 2022-11-26 DIAGNOSIS — I2510 Atherosclerotic heart disease of native coronary artery without angina pectoris: Secondary | ICD-10-CM

## 2022-11-26 HISTORY — PX: HYDROCELE EXCISION: SHX482

## 2022-11-26 LAB — GLUCOSE, CAPILLARY: Glucose-Capillary: 112 mg/dL — ABNORMAL HIGH (ref 70–99)

## 2022-11-26 SURGERY — HYDROCELECTOMY
Anesthesia: General | Site: Scrotum | Laterality: Right

## 2022-11-26 MED ORDER — ONDANSETRON HCL 4 MG/2ML IJ SOLN: 4.0000 mg | Freq: Once | INTRAMUSCULAR | Status: DC | PRN

## 2022-11-26 MED ORDER — DEXAMETHASONE SODIUM PHOSPHATE 4 MG/ML IJ SOLN
INTRAMUSCULAR | Status: DC | PRN
  Administered 2022-11-26: 5 mg via INTRAVENOUS

## 2022-11-26 MED ORDER — MIDAZOLAM HCL 5 MG/5ML IJ SOLN
INTRAMUSCULAR | Status: DC | PRN
  Administered 2022-11-26: 2 mg via INTRAVENOUS

## 2022-11-26 MED ORDER — PROPOFOL 10 MG/ML IV BOLUS
INTRAVENOUS | Status: DC | PRN
  Administered 2022-11-26: 160 mg via INTRAVENOUS

## 2022-11-26 MED ORDER — FENTANYL CITRATE (PF) 100 MCG/2ML IJ SOLN
INTRAMUSCULAR | Status: DC | PRN
  Administered 2022-11-26: 100 ug via INTRAVENOUS

## 2022-11-26 MED ORDER — LIDOCAINE HCL (CARDIAC) PF 100 MG/5ML IV SOSY
PREFILLED_SYRINGE | INTRAVENOUS | Status: DC | PRN
  Administered 2022-11-26: 100 mg via INTRAVENOUS

## 2022-11-26 MED ORDER — LACTATED RINGERS IV SOLN: INTRAVENOUS | Status: DC

## 2022-11-26 MED ORDER — EPHEDRINE SULFATE (PRESSORS) 50 MG/ML IJ SOLN
INTRAMUSCULAR | Status: DC | PRN
  Administered 2022-11-26: 7.5 mg via INTRAVENOUS

## 2022-11-26 MED ORDER — HYDROCODONE-ACETAMINOPHEN 5-325 MG PO TABS: 1.0000 | ORAL_TABLET | ORAL | 0 refills | Status: DC | PRN

## 2022-11-26 MED ORDER — 0.9 % SODIUM CHLORIDE (POUR BTL) OPTIME
TOPICAL | Status: DC | PRN
  Administered 2022-11-26: 1000 mL

## 2022-11-26 MED ORDER — FENTANYL CITRATE PF 50 MCG/ML IJ SOSY: 25.0000 ug | PREFILLED_SYRINGE | INTRAMUSCULAR | Status: DC | PRN

## 2022-11-26 MED ORDER — ACETAMINOPHEN 500 MG PO TABS
1000.0000 mg | ORAL_TABLET | Freq: Once | ORAL | Status: AC
  Administered 2022-11-26: 1000 mg via ORAL
  Filled 2022-11-26: qty 2

## 2022-11-26 MED ORDER — ORAL CARE MOUTH RINSE: 15.0000 mL | Freq: Once | OROMUCOSAL | Status: AC

## 2022-11-26 MED ORDER — ONDANSETRON HCL 4 MG/2ML IJ SOLN
INTRAMUSCULAR | Status: AC
  Filled 2022-11-26: qty 2

## 2022-11-26 MED ORDER — EPHEDRINE 5 MG/ML INJ
INTRAVENOUS | Status: AC
  Filled 2022-11-26: qty 5

## 2022-11-26 MED ORDER — DEXAMETHASONE SODIUM PHOSPHATE 10 MG/ML IJ SOLN
INTRAMUSCULAR | Status: AC
  Filled 2022-11-26: qty 1

## 2022-11-26 MED ORDER — LIDOCAINE HCL (PF) 2 % IJ SOLN
INTRAMUSCULAR | Status: AC
  Filled 2022-11-26: qty 5

## 2022-11-26 MED ORDER — CHLORHEXIDINE GLUCONATE 0.12 % MT SOLN
15.0000 mL | Freq: Once | OROMUCOSAL | Status: AC
  Administered 2022-11-26: 15 mL via OROMUCOSAL

## 2022-11-26 MED ORDER — FENTANYL CITRATE (PF) 100 MCG/2ML IJ SOLN
INTRAMUSCULAR | Status: AC
  Filled 2022-11-26: qty 2

## 2022-11-26 MED ORDER — MIDAZOLAM HCL 2 MG/2ML IJ SOLN
INTRAMUSCULAR | Status: AC
  Filled 2022-11-26: qty 2

## 2022-11-26 MED ORDER — ONDANSETRON HCL 4 MG/2ML IJ SOLN
INTRAMUSCULAR | Status: DC | PRN
  Administered 2022-11-26: 4 mg via INTRAVENOUS

## 2022-11-26 MED ORDER — PROPOFOL 10 MG/ML IV BOLUS
INTRAVENOUS | Status: AC
  Filled 2022-11-26: qty 20

## 2022-11-26 MED ORDER — BUPIVACAINE HCL (PF) 0.25 % IJ SOLN
INTRAMUSCULAR | Status: DC | PRN
  Administered 2022-11-26: 10 mL

## 2022-11-26 MED ORDER — BUPIVACAINE HCL 0.25 % IJ SOLN
INTRAMUSCULAR | Status: AC
  Filled 2022-11-26: qty 1

## 2022-11-26 MED ORDER — CEFAZOLIN SODIUM-DEXTROSE 2-4 GM/100ML-% IV SOLN
2.0000 g | INTRAVENOUS | Status: AC
  Administered 2022-11-26: 2 g via INTRAVENOUS
  Filled 2022-11-26: qty 100

## 2022-11-26 SURGICAL SUPPLY — 30 items
BAG COUNTER SPONGE SURGICOUNT (BAG) IMPLANT
BLADE CLIPPER SURG (BLADE) ×1 IMPLANT
BLADE HEX COATED 2.75 (ELECTRODE) ×1 IMPLANT
BNDG GAUZE DERMACEA FLUFF 4 (GAUZE/BANDAGES/DRESSINGS) ×1 IMPLANT
COVER SURGICAL LIGHT HANDLE (MISCELLANEOUS) ×1 IMPLANT
DERMABOND ADVANCED .7 DNX12 (GAUZE/BANDAGES/DRESSINGS) ×1 IMPLANT
DRAIN PENROSE 0.25X18 (DRAIN) IMPLANT
DRAPE LAPAROTOMY T 98X78 PEDS (DRAPES) ×1 IMPLANT
ELECT REM PT RETURN 15FT ADLT (MISCELLANEOUS) ×1 IMPLANT
GLOVE BIO SURGEON STRL SZ7.5 (GLOVE) ×2 IMPLANT
KIT BASIN OR (CUSTOM PROCEDURE TRAY) ×1 IMPLANT
KIT TURNOVER KIT A (KITS) IMPLANT
NEEDLE HYPO 22GX1.5 SAFETY (NEEDLE) IMPLANT
NS IRRIG 1000ML POUR BTL (IV SOLUTION) ×1 IMPLANT
PACK GENERAL/GYN (CUSTOM PROCEDURE TRAY) ×1 IMPLANT
SOL PREP POV-IOD 4OZ 10% (MISCELLANEOUS) ×1 IMPLANT
SUPPORT SCROTAL LG STRP (MISCELLANEOUS) ×1 IMPLANT
SUT CHROMIC 3 0 SH 27 (SUTURE) IMPLANT
SUT PDS AB 4-0 SH 27 (SUTURE) ×1 IMPLANT
SUT SILK 2 0 (SUTURE) ×1
SUT SILK 2 0 SH (SUTURE) ×1 IMPLANT
SUT SILK 2-0 18XBRD TIE 12 (SUTURE) ×1 IMPLANT
SUT VIC AB 0 UR5 27 (SUTURE) ×1 IMPLANT
SUT VIC AB 2-0 SH 27 (SUTURE) ×1
SUT VIC AB 2-0 SH 27XBRD (SUTURE) IMPLANT
SUT VIC AB 2-0 UR5 27 (SUTURE) IMPLANT
SUT VICRYL 0 TIES 12 18 (SUTURE) IMPLANT
SYR CONTROL 10ML LL (SYRINGE) IMPLANT
TOWEL OR 17X26 10 PK STRL BLUE (TOWEL DISPOSABLE) ×1 IMPLANT
WATER STERILE IRR 1000ML POUR (IV SOLUTION) ×1 IMPLANT

## 2022-11-26 NOTE — Transfer of Care (Signed)
Immediate Anesthesia Transfer of Care Note  Patient: Omar Barker  Procedure(s) Performed: RIGHT HYDROCELECTOMY ADULT (Right: Scrotum)  Patient Location: PACU  Anesthesia Type:General  Level of Consciousness: drowsy and patient cooperative  Airway & Oxygen Therapy: Patient Spontanous Breathing and Patient connected to face mask oxygen  Post-op Assessment: Report given to RN and Post -op Vital signs reviewed and stable  Post vital signs: Reviewed and stable  Last Vitals:  Vitals Value Taken Time  BP 117/82 11/26/22 1535  Temp    Pulse 63 11/26/22 1537  Resp 12 11/26/22 1537  SpO2 100 % 11/26/22 1537  Vitals shown include unvalidated device data.  Last Pain:  Vitals:   11/26/22 1228  TempSrc:   PainSc: 0-No pain         Complications: No notable events documented.

## 2022-11-26 NOTE — Anesthesia Preprocedure Evaluation (Addendum)
Anesthesia Evaluation  Patient identified by MRN, date of birth, ID band Patient awake    Reviewed: Allergy & Precautions, NPO status , Patient's Chart, lab work & pertinent test results  Airway Mallampati: III  TM Distance: >3 FB Neck ROM: Full    Dental  (+) Teeth Intact, Dental Advisory Given   Pulmonary neg pulmonary ROS   Pulmonary exam normal breath sounds clear to auscultation       Cardiovascular hypertension, Pt. on medications Normal cardiovascular exam Rhythm:Regular Rate:Normal     Neuro/Psych negative neurological ROS     GI/Hepatic negative GI ROS, Neg liver ROS,,,  Endo/Other  diabetes, Type 2, Oral Hypoglycemic Agents  Obesity   Renal/GU negative Renal ROSRIGHT HYDRONEPHROSIS     Musculoskeletal negative musculoskeletal ROS (+)    Abdominal   Peds  Hematology negative hematology ROS (+)   Anesthesia Other Findings Day of surgery medications reviewed with the patient.  Reproductive/Obstetrics                             Anesthesia Physical Anesthesia Plan  ASA: 2  Anesthesia Plan: General   Post-op Pain Management: Tylenol PO (pre-op)*   Induction: Intravenous  PONV Risk Score and Plan: 3 and Dexamethasone and Ondansetron  Airway Management Planned: LMA  Additional Equipment:   Intra-op Plan:   Post-operative Plan: Extubation in OR  Informed Consent: I have reviewed the patients History and Physical, chart, labs and discussed the procedure including the risks, benefits and alternatives for the proposed anesthesia with the patient or authorized representative who has indicated his/her understanding and acceptance.     Dental advisory given and Interpreter used for interveiw  Plan Discussed with: CRNA  Anesthesia Plan Comments:        Anesthesia Quick Evaluation

## 2022-11-26 NOTE — Anesthesia Procedure Notes (Signed)
Procedure Name: LMA Insertion Date/Time: 11/26/2022 2:25 PM  Performed by: Claudia Desanctis, CRNAPre-anesthesia Checklist: Emergency Drugs available, Patient identified, Suction available and Patient being monitored Patient Re-evaluated:Patient Re-evaluated prior to induction Oxygen Delivery Method: Circle system utilized Preoxygenation: Pre-oxygenation with 100% oxygen Induction Type: IV induction Ventilation: Mask ventilation without difficulty LMA: LMA inserted LMA Size: 4.0 Number of attempts: 1 Placement Confirmation: positive ETCO2 and breath sounds checked- equal and bilateral Tube secured with: Tape Dental Injury: Teeth and Oropharynx as per pre-operative assessment

## 2022-11-26 NOTE — H&P (Signed)
CC/HPI: CC: Right scrotal swelling  HPI:  72 year old male has an at least 2 month history of right-sided hemiscrotal swelling. He has very occasional right-sided pain but seldom. He denies any significant lower urinary tract symptoms. He was prescribed an antibiotic when this initially occurred and it got a little bit better. He denies dysuria. No recent PSA. He would like to have PSA screening.   06/01/2021: Back today for scrotal ultrasound to identify the previously diagnosed right hydrocele as well as rule out the possibility of a right inguinal hernia. Doing well today, he states the swelling and associated discomfort is about 10-15% better compared to when he was previously seen. He states being somewhat hesitant about pursuing surgical intervention.   10/28/2022  At the last visit, scrotal ultrasound confirmed hydrocele. He now wants to proceed with right-sided hydrocelectomy. It bothers him when he walks. Has some discomfort at times from the hydrocele.     ALLERGIES: No Allergies    MEDICATIONS: Metformin Hcl  Atorvastatin Calcium 20 mg tablet  Lisinopril-Hydrochlorothiazide 20 mg-25 mg tablet     GU PSH: None   NON-GU PSH: None   GU PMH: Hydrocele - 06/01/2021, - 05/14/2021 BPH w/o LUTS - 05/14/2021 Encounter for Prostate Cancer screening - 05/14/2021    NON-GU PMH: Anxiety Hypertension Prediabetes    FAMILY HISTORY: No Family History    SOCIAL HISTORY: No Social History    REVIEW OF SYSTEMS:    GU Review Male:   Patient reports frequent urination and get up at night to urinate. Patient denies hard to postpone urination, burning/ pain with urination, leakage of urine, stream starts and stops, trouble starting your stream, have to strain to urinate , erection problems, and penile pain.  Gastrointestinal (Upper):   Patient denies nausea, vomiting, and indigestion/ heartburn.  Gastrointestinal (Lower):   Patient denies constipation and diarrhea.  Constitutional:   Patient  denies fever, night sweats, weight loss, and fatigue.  Skin:   Patient denies skin rash/ lesion and itching.  Eyes:   Patient denies blurred vision and double vision.  Ears/ Nose/ Throat:   Patient denies sore throat and sinus problems.  Hematologic/Lymphatic:   Patient denies swollen glands and easy bruising.  Cardiovascular:   Patient denies leg swelling and chest pains.  Respiratory:   Patient denies cough and shortness of breath.  Endocrine:   Patient denies excessive thirst.  Musculoskeletal:   Patient denies back pain and joint pain.  Neurological:   Patient denies headaches and dizziness.  Psychologic:   Patient denies depression and anxiety.   VITAL SIGNS:      10/28/2022 03:53 PM  BP 147/89 mmHg  Pulse 63 /min  Temperature 97.5 F / 36.3 C   GU PHYSICAL EXAMINATION:    Testes: Left testicle descended without masses. Normal. Right hemiscrotal swelling consistent with hydrocele   MULTI-SYSTEM PHYSICAL EXAMINATION:       Complexity of Data:  Source Of History:  Patient  Records Review:   Previous Doctor Records, Previous Patient Records  X-Ray Review: Scrotal Ultrasound: Reviewed Films. Discussed With Patient.     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 Risk benefits discussed including but not limited to bleeding, infection, injury to surrounding  structures, need for additional procedures, recurrence, wound dehiscence, testicular injury/loss.   CC: Dr. Mitchel Honour    Signed by Link Snuffer, III, M.D. on 10/28/22 at 4:33 PM (EST

## 2022-11-26 NOTE — Discharge Instructions (Signed)
Discharge instructions following scrotal surgery  Call your doctor for: Fever is greater than 100.5 Severe nausea or vomiting Increasing pain not controlled by pain medication Increasing redness or drainage from incisions  The number for questions or concerns is [phone]  Activity level: No lifting greater than 20 pounds (about equal to milk) for the next 2 weeks or until cleared to do so at follow-up appointment.  Otherwise activity as tolerated by comfort level.  Diet: May resume your regular diet as tolerated  Driving: No driving while still taking opiate pain medications (weight at least 6-8 hours after last dose).  No driving if you still sore from surgery as it may limit her ability to react quickly if necessary.   Shower/bath: May shower and get incision wet pad dry immediately following.  Do not scrub vigorously for the next 2-3 weeks.  Do not soak incision (ID soaking in bath or swimming) until told he may do so by Dr., as this may promote a wound infection.  Wound care: He may cover wounds with sterile gauze as needed to prevent incisions rubbing on close follow-up in any seepage.  Where tight fitting underpants/scrotal support for at least 2 weeks.  He should apply cold compresses (ice or sac of frozen peas/corn) to your scrotum for at least 48 hours to reduce the swelling for 15 minutes at a time indirectly.  You should expect that his scrotum will swell up initially and then get smaller over the next 2-4 weeks.  Follow-up appointments: Follow-up appointment will be scheduled with Dr. Addilyn Satterwhite for a wound check.  

## 2022-11-26 NOTE — Op Note (Signed)
.  Operative Note  Preoperative diagnosis:  1.  Right hydrocele  Postoperative diagnosis: 1.  Right hydrocele  Procedure(s): 1.  Right hydrocelectomy  Surgeon: Link Snuffer, MD  Assistants: none  Anesthesia: Gen.  Complications: none immediate  EBL: 25 mL  Specimens: 1. none  Drains/Catheters: 1.  Scrotal Penrose drain  Condition following The operation: stable  Intraoperative findings: Right hydrocele  Indication: the patient is a 72 year old man with scrotal swelling found to have a right hydrocele confirmed by ultrasound.  Description of procedure:  The patient was identified and consent was obtained. He was taken back to the operating room and placed in the supine position. He was placed under general anesthesia and prepped and draped in the standard sterile fashion.Perioperative antibiotic was given. Timeout was performed. A 5 cm right hemiscrotal transverse incision was made. This was carried down through the dartos and the testicle along with the hydrocele sac was delivered onto the operative field. The gubernacular attachments were released with a combination of blunt dissection and electrocautery. Spot electrocautery was used for hemostasis as necessary. Care was taken to not use electrocautery close to the cord itself. The hydrocele sac was incised and fluid was evacuated. I extended the hydrocele sac incision proximally and distally. Excess sac was excised. This was discarded. The hydrocele sac was everted around the testicle and loosely reapproximated with a 2-0 Vicryl.  Hemostasis was obtained carefully with Bovie electrocautery. I irrigated the scrotal cavity and there was good hemostasis. The testicle was delivered back into the scrotum in its proper anatomical position.  The Penrose drain was brought out in the right side of the scrotum and was secured down to the skin with a 2-0 PDS suture.  The dartos was closed with a running 2-0 Vicryl. The skin was closed with  interrupted 3-0 chromic sutures. 10 mL of quarter percent Marcaine without epinephrine was instilled for anesthetic affect. Dermabond was applied and then a dressing along with scrotal support was applied. This concluded the operation. The patient tolerated the procedure well and was stable postoperatively.  Plan: The patient will return in 3 to 4 days for drain removal

## 2022-11-29 ENCOUNTER — Encounter (HOSPITAL_COMMUNITY): Payer: Self-pay | Admitting: Urology

## 2022-11-29 NOTE — Anesthesia Postprocedure Evaluation (Signed)
Anesthesia Post Note  Patient: Omar Barker  Procedure(s) Performed: RIGHT HYDROCELECTOMY ADULT (Right: Scrotum)     Patient location during evaluation: PACU Anesthesia Type: General Level of consciousness: awake and alert Pain management: pain level controlled Vital Signs Assessment: post-procedure vital signs reviewed and stable Respiratory status: spontaneous breathing, nonlabored ventilation, respiratory function stable and patient connected to nasal cannula oxygen Cardiovascular status: blood pressure returned to baseline and stable Postop Assessment: no apparent nausea or vomiting Anesthetic complications: no   No notable events documented.  Last Vitals:  Vitals:   11/26/22 1630 11/26/22 1655  BP: (!) 143/83 134/84  Pulse: (!) 59 64  Resp:  16  Temp:  36.4 C  SpO2: 97% 97%    Last Pain:  Vitals:   11/26/22 1655  TempSrc: Oral  PainSc: 3                  Tiajuana Amass

## 2023-04-04 ENCOUNTER — Ambulatory Visit: Payer: Commercial Managed Care - HMO | Admitting: Emergency Medicine

## 2023-10-22 ENCOUNTER — Other Ambulatory Visit: Payer: Self-pay | Admitting: Emergency Medicine

## 2023-10-22 DIAGNOSIS — I152 Hypertension secondary to endocrine disorders: Secondary | ICD-10-CM

## 2023-10-28 ENCOUNTER — Other Ambulatory Visit: Payer: Self-pay | Admitting: Emergency Medicine

## 2023-10-28 DIAGNOSIS — E1159 Type 2 diabetes mellitus with other circulatory complications: Secondary | ICD-10-CM

## 2023-11-01 ENCOUNTER — Other Ambulatory Visit: Payer: Self-pay | Admitting: Emergency Medicine

## 2023-11-01 ENCOUNTER — Ambulatory Visit: Payer: Self-pay | Admitting: Emergency Medicine

## 2023-11-01 ENCOUNTER — Encounter: Payer: Self-pay | Admitting: Emergency Medicine

## 2023-11-01 VITALS — BP 124/82 | HR 64 | Temp 98.2°F | Ht 67.0 in | Wt 210.4 lb

## 2023-11-01 DIAGNOSIS — E785 Hyperlipidemia, unspecified: Secondary | ICD-10-CM | POA: Diagnosis not present

## 2023-11-01 DIAGNOSIS — E1169 Type 2 diabetes mellitus with other specified complication: Secondary | ICD-10-CM | POA: Diagnosis not present

## 2023-11-01 DIAGNOSIS — E1159 Type 2 diabetes mellitus with other circulatory complications: Secondary | ICD-10-CM

## 2023-11-01 DIAGNOSIS — I152 Hypertension secondary to endocrine disorders: Secondary | ICD-10-CM | POA: Diagnosis not present

## 2023-11-01 DIAGNOSIS — Z7984 Long term (current) use of oral hypoglycemic drugs: Secondary | ICD-10-CM

## 2023-11-01 LAB — CBC WITH DIFFERENTIAL/PLATELET
Basophils Absolute: 0.1 10*3/uL (ref 0.0–0.1)
Basophils Relative: 0.8 % (ref 0.0–3.0)
Eosinophils Absolute: 0.3 10*3/uL (ref 0.0–0.7)
Eosinophils Relative: 4.1 % (ref 0.0–5.0)
HCT: 46 % (ref 39.0–52.0)
Hemoglobin: 14.7 g/dL (ref 13.0–17.0)
Lymphocytes Relative: 26.2 % (ref 12.0–46.0)
Lymphs Abs: 1.8 10*3/uL (ref 0.7–4.0)
MCHC: 32 g/dL (ref 30.0–36.0)
MCV: 81.1 fL (ref 78.0–100.0)
Monocytes Absolute: 0.6 10*3/uL (ref 0.1–1.0)
Monocytes Relative: 8 % (ref 3.0–12.0)
Neutro Abs: 4.2 10*3/uL (ref 1.4–7.7)
Neutrophils Relative %: 60.9 % (ref 43.0–77.0)
Platelets: 232 10*3/uL (ref 150.0–400.0)
RBC: 5.67 Mil/uL (ref 4.22–5.81)
RDW: 14.6 % (ref 11.5–15.5)
WBC: 6.9 10*3/uL (ref 4.0–10.5)

## 2023-11-01 LAB — LIPID PANEL
Cholesterol: 128 mg/dL (ref 0–200)
HDL: 30.4 mg/dL — ABNORMAL LOW (ref >39.00)
LDL Cholesterol: 77 mg/dL (ref 0–99)
NonHDL: 97.32
Total CHOL/HDL Ratio: 4
Triglycerides: 102 mg/dL (ref 0.0–149.0)
VLDL: 20.4 mg/dL (ref 0.0–40.0)

## 2023-11-01 LAB — COMPREHENSIVE METABOLIC PANEL
ALT: 34 U/L (ref 0–53)
AST: 33 U/L (ref 0–37)
Albumin: 4.2 g/dL (ref 3.5–5.2)
Alkaline Phosphatase: 85 U/L (ref 39–117)
BUN: 12 mg/dL (ref 6–23)
CO2: 29 meq/L (ref 19–32)
Calcium: 9.3 mg/dL (ref 8.4–10.5)
Chloride: 101 meq/L (ref 96–112)
Creatinine, Ser: 0.77 mg/dL (ref 0.40–1.50)
GFR: 90.75 mL/min (ref >60.00)
Glucose, Bld: 92 mg/dL (ref 70–99)
Potassium: 3.6 meq/L (ref 3.5–5.1)
Sodium: 137 meq/L (ref 135–145)
Total Bilirubin: 0.8 mg/dL (ref 0.2–1.2)
Total Protein: 7.4 g/dL (ref 6.0–8.3)

## 2023-11-01 LAB — MICROALBUMIN / CREATININE URINE RATIO
Creatinine,U: 44.7 mg/dL
Microalb Creat Ratio: 9.3 mg/g (ref 0.0–30.0)
Microalb, Ur: 4.2 mg/dL — ABNORMAL HIGH (ref 0.0–1.9)

## 2023-11-01 LAB — POCT GLYCOSYLATED HEMOGLOBIN (HGB A1C): Hemoglobin A1C: 6.6 % — AB (ref 4.0–5.6)

## 2023-11-01 NOTE — Assessment & Plan Note (Signed)
Stable chronic conditions Hemoglobin A1c is 6.6 Continue metformin 500 mg twice a day and atorvastatin 20 mg daily Lipid profile done today Diet and nutrition discussed The 10-year ASCVD risk score (Arnett DK, et al., 2019) is: 33.5%   Values used to calculate the score:     Age: 72 years     Sex: Male     Is Non-Hispanic African American: No     Diabetic: Yes     Tobacco smoker: No     Systolic Blood Pressure: 124 mmHg     Is BP treated: Yes     HDL Cholesterol: 34.1 mg/dL     Total Cholesterol: 131 mg/dL Follow-up in 6 months

## 2023-11-01 NOTE — Patient Instructions (Signed)
Health Maintenance After Age 70 After age 70, you are at a higher risk for certain long-term diseases and infections as well as injuries from falls. Falls are a major cause of broken bones and head injuries in people who are older than age 70. Getting regular preventive care can help to keep you healthy and well. Preventive care includes getting regular testing and making lifestyle changes as recommended by your health care provider. Talk with your health care provider about: Which screenings and tests you should have. A screening is a test that checks for a disease when you have no symptoms. A diet and exercise plan that is right for you. What should I know about screenings and tests to prevent falls? Screening and testing are the best ways to find a health problem early. Early diagnosis and treatment give you the best chance of managing medical conditions that are common after age 70. Certain conditions and lifestyle choices may make you more likely to have a fall. Your health care provider may recommend: Regular vision checks. Poor vision and conditions such as cataracts can make you more likely to have a fall. If you wear glasses, make sure to get your prescription updated if your vision changes. Medicine review. Work with your health care provider to regularly review all of the medicines you are taking, including over-the-counter medicines. Ask your health care provider about any side effects that may make you more likely to have a fall. Tell your health care provider if any medicines that you take make you feel dizzy or sleepy. Strength and balance checks. Your health care provider may recommend certain tests to check your strength and balance while standing, walking, or changing positions. Foot health exam. Foot pain and numbness, as well as not wearing proper footwear, can make you more likely to have a fall. Screenings, including: Osteoporosis screening. Osteoporosis is a condition that causes  the bones to get weaker and break more easily. Blood pressure screening. Blood pressure changes and medicines to control blood pressure can make you feel dizzy. Depression screening. You may be more likely to have a fall if you have a fear of falling, feel depressed, or feel unable to do activities that you used to do. Alcohol use screening. Using too much alcohol can affect your balance and may make you more likely to have a fall. Follow these instructions at home: Lifestyle Do not drink alcohol if: Your health care provider tells you not to drink. If you drink alcohol: Limit how much you have to: 0-1 drink a day for women. 0-2 drinks a day for men. Know how much alcohol is in your drink. In the [place] one drink equals one 12 oz bottle of beer (355 mL), one 5 oz glass of wine (148 mL), or one 1 oz glass of hard liquor (44 mL). Do not use any products that contain nicotine or tobacco. These products include cigarettes, chewing tobacco, and vaping devices, such as e-cigarettes. If you need help quitting, ask your health care provider. Activity  Follow a regular exercise program to stay fit. This will help you maintain your balance. Ask your health care provider what types of exercise are appropriate for you. If you need a cane or walker, use it as recommended by your health care provider. Wear supportive shoes that have nonskid soles. Safety  Remove any tripping hazards, such as rugs, cords, and clutter. Install safety equipment such as grab bars in bathrooms and safety rails on stairs. Keep rooms and walkways   well-lit. General instructions Talk with your health care provider about your risks for falling. Tell your health care provider if: You fall. Be sure to tell your health care provider about all falls, even ones that seem minor. You feel dizzy, tiredness (fatigue), or off-balance. Take over-the-counter and prescription medicines only as told by your health care provider. These include  supplements. Eat a healthy diet and maintain a healthy weight. A healthy diet includes low-fat dairy products, low-fat (lean) meats, and fiber from whole grains, beans, and lots of fruits and vegetables. Stay current with your vaccines. Schedule regular health, dental, and eye exams. Summary Having a healthy lifestyle and getting preventive care can help to protect your health and wellness after age 70. Screening and testing are the best way to find a health problem early and help you avoid having a fall. Early diagnosis and treatment give you the best chance for managing medical conditions that are more common for people who are older than age 70. Falls are a major cause of broken bones and head injuries in people who are older than age 70. Take precautions to prevent a fall at home. Work with your health care provider to learn what changes you can make to improve your health and wellness and to prevent falls. This information is not intended to replace advice given to you by your health care provider. Make sure you discuss any questions you have with your health care provider. Document Revised: 03/30/2021 Document Reviewed: 03/30/2021 Elsevier Patient Education  2024 [employer]  

## 2023-11-01 NOTE — Assessment & Plan Note (Signed)
BP Readings from Last 3 Encounters:  11/01/23 124/82  11/26/22 134/84  11/09/22 (!) 135/94  Well-controlled hypertension Continue Zestoretic 10-12.5 mg daily Well-controlled diabetes with hemoglobin A1c of 6.6 Continue metformin 500 mg twice a day Cardiovascular risks associated with hypertension and diabetes discussed Diet and nutrition discussed Blood work done today Recommend follow-up in 6 months

## 2023-11-01 NOTE — Progress Notes (Signed)
Omar Barker 72 y.o.   Chief Complaint  Patient presents with   Follow-up    Here for medication follow up. Patient wants suggestions on vitamins he should take     HISTORY OF PRESENT ILLNESS: This is a 72 y.o. male here for follow-up of chronic medical conditions including hypertension and diabetes Accompanied by son who is helping with translation Overall doing well.  Has no complaints or medical concerns today.  HPI   Prior to Admission medications   Medication Sig Start Date End Date Taking? Authorizing Provider  atorvastatin (LIPITOR) 20 MG tablet Take 1 tablet (20 mg total) by mouth daily. 10/04/22  Yes Sudiksha Victor, Eilleen Kempf, MD  lisinopril-hydrochlorothiazide (ZESTORETIC) 10-12.5 MG tablet Take 1 tablet by mouth daily. 10/04/22  Yes Duana Benedict, Eilleen Kempf, MD  metFORMIN (GLUCOPHAGE) 500 MG tablet TAKE 1 TABLET BY MOUTH 2 TIMES DAILY WITH A MEAL. 10/23/23  Yes Gissele Narducci, Eilleen Kempf, MD  HYDROcodone-acetaminophen (NORCO/VICODIN) 5-325 MG tablet Take 1 tablet by mouth every 4 (four) hours as needed for moderate pain. Patient not taking: Reported on 11/01/2023 11/26/22 11/26/23  Crista Elliot, MD    No Known Allergies  Patient Active Problem List   Diagnosis Date Noted   Right hydrocele 10/04/2022   Nocturia 08/17/2022   Dyslipidemia associated with type 2 diabetes mellitus (HCC) 05/17/2022   Testicular mass 05/17/2022   Prediabetes 10/10/2019   Dyslipidemia 05/31/2017   Hypertension associated with diabetes (HCC) 11/25/2014    Past Medical History:  Diagnosis Date   Diabetes mellitus without complication (HCC)    Hypertension     Past Surgical History:  Procedure Laterality Date   HYDROCELE EXCISION Right 11/26/2022   Procedure: RIGHT HYDROCELECTOMY ADULT;  Surgeon: Crista Elliot, MD;  Location: [facility];  Service: Urology;  Laterality: Right;  1 HR FOR CASE    Social History   Socioeconomic History   Marital status: Married    Spouse name: Not on file    Number of children: Not on file   Years of education: Not on file   Highest education level: Not on file  Occupational History   Not on file  Tobacco Use   Smoking status: Never   Smokeless tobacco: Never  Vaping Use   Vaping status: Never Used  Substance and Sexual Activity   Alcohol use: No    Alcohol/week: 0.0 standard drinks of alcohol   Drug use: No   Sexual activity: Not on file  Other Topics Concern   Not on file  Social History Narrative   Not on file   Social Determinants of Health   Financial Resource Strain: Not on file  Food Insecurity: Not on file  Transportation Needs: Not on file  Physical Activity: Not on file  Stress: Not on file  Social Connections: Not on file  Intimate Partner Violence: Not on file    History reviewed. No pertinent family history.   Review of Systems  Constitutional: Negative.  Negative for chills and fever.  HENT: Negative.  Negative for congestion and sore throat.   Respiratory: Negative.  Negative for cough and shortness of breath.   Cardiovascular: Negative.  Negative for chest pain and palpitations.  Gastrointestinal:  Negative for abdominal pain, diarrhea, nausea and vomiting.  Genitourinary: Negative.  Negative for dysuria and hematuria.  Skin: Negative.  Negative for rash.  Neurological: Negative.  Negative for dizziness and headaches.  All other systems reviewed and are negative.   Vitals:   11/01/23 1504  BP:  124/82  Pulse: 64  Temp: 98.2 F (36.8 C)  SpO2: 95%    Physical Exam Vitals reviewed.  Constitutional:      Appearance: Normal appearance.  HENT:     Head: Normocephalic.     Mouth/Throat:     Mouth: Mucous membranes are moist.     Pharynx: Oropharynx is clear.  Eyes:     Extraocular Movements: Extraocular movements intact.     Conjunctiva/sclera: Conjunctivae normal.     Pupils: Pupils are equal, round, and reactive to light.  Cardiovascular:     Rate and Rhythm: Normal rate and regular rhythm.      Pulses: Normal pulses.     Heart sounds: Normal heart sounds.  Pulmonary:     Effort: Pulmonary effort is normal.     Breath sounds: Normal breath sounds.  Abdominal:     Palpations: Abdomen is soft.     Tenderness: There is no abdominal tenderness.  Musculoskeletal:        General: Normal range of motion.     Cervical back: No tenderness.  Lymphadenopathy:     Cervical: No cervical adenopathy.  Skin:    General: Skin is warm and dry.     Capillary Refill: Capillary refill takes less than 2 seconds.  Neurological:     General: No focal deficit present.     Mental Status: He is alert and oriented to person, place, and time.  Psychiatric:        Mood and Affect: Mood normal.        Behavior: Behavior normal.    Results for orders placed or performed in visit on 11/01/23 (from the past 24 hour(s))  POCT HgB A1C     Status: Abnormal   Collection Time: 11/01/23  3:22 PM  Result Value Ref Range   Hemoglobin A1C 6.6 (A) 4.0 - 5.6 %   HbA1c POC (<> result, manual entry)     HbA1c, POC (prediabetic range)     HbA1c, POC (controlled diabetic range)       ASSESSMENT & PLAN: A total of 44 minutes was spent with the patient and counseling/coordination of care regarding preparing for this visit, review of most recent office visit notes, review of multiple chronic medical conditions and their management, review of all medications, review of most recent bloodwork results including interpretation of today's hemoglobin A1c, review of health maintenance items, education on nutrition, prognosis, documentation, and need for follow up. .  Problem List Items Addressed This Visit       Cardiovascular and Mediastinum   Hypertension associated with diabetes (HCC) - Primary    BP Readings from Last 3 Encounters:  11/01/23 124/82  11/26/22 134/84  11/09/22 (!) 135/94  Well-controlled hypertension Continue Zestoretic 10-12.5 mg daily Well-controlled diabetes with hemoglobin A1c of  6.6 Continue metformin 500 mg twice a day Cardiovascular risks associated with hypertension and diabetes discussed Diet and nutrition discussed Blood work done today Recommend follow-up in 6 months       Relevant Orders   Urine Microalbumin w/creat. ratio   CBC with Differential/Platelet   Comprehensive metabolic panel   Lipid panel   POCT HgB A1C (Completed)     Endocrine   Dyslipidemia associated with type 2 diabetes mellitus (HCC)    Stable chronic conditions Hemoglobin A1c is 6.6 Continue metformin 500 mg twice a day and atorvastatin 20 mg daily Lipid profile done today Diet and nutrition discussed The 10-year ASCVD risk score (Arnett DK, et al., 2019) is: 33.5%  Values used to calculate the score:     Age: 67 years     Sex: Male     Is Non-Hispanic African American: No     Diabetic: Yes     Tobacco smoker: No     Systolic Blood Pressure: 124 mmHg     Is BP treated: Yes     HDL Cholesterol: 34.1 mg/dL     Total Cholesterol: 131 mg/dL Follow-up in 6 months      Relevant Orders   Urine Microalbumin w/creat. ratio   CBC with Differential/Platelet   Comprehensive metabolic panel   Lipid panel   Patient Instructions  Health Maintenance After Age 37 After age 85, you are at a higher risk for certain long-term diseases and infections as well as injuries from falls. Falls are a major cause of broken bones and head injuries in people who are older than age 44. Getting regular preventive care can help to keep you healthy and well. Preventive care includes getting regular testing and making lifestyle changes as recommended by your health care provider. Talk with your health care provider about: Which screenings and tests you should have. A screening is a test that checks for a disease when you have no symptoms. A diet and exercise plan that is right for you. What should I know about screenings and tests to prevent falls? Screening and testing are the best ways to find a  health problem early. Early diagnosis and treatment give you the best chance of managing medical conditions that are common after age 54. Certain conditions and lifestyle choices may make you more likely to have a fall. Your health care provider may recommend: Regular vision checks. Poor vision and conditions such as cataracts can make you more likely to have a fall. If you wear glasses, make sure to get your prescription updated if your vision changes. Medicine review. Work with your health care provider to regularly review all of the medicines you are taking, including over-the-counter medicines. Ask your health care provider about any side effects that may make you more likely to have a fall. Tell your health care provider if any medicines that you take make you feel dizzy or sleepy. Strength and balance checks. Your health care provider may recommend certain tests to check your strength and balance while standing, walking, or changing positions. Foot health exam. Foot pain and numbness, as well as not wearing proper footwear, can make you more likely to have a fall. Screenings, including: Osteoporosis screening. Osteoporosis is a condition that causes the bones to get weaker and break more easily. Blood pressure screening. Blood pressure changes and medicines to control blood pressure can make you feel dizzy. Depression screening. You may be more likely to have a fall if you have a fear of falling, feel depressed, or feel unable to do activities that you used to do. Alcohol use screening. Using too much alcohol can affect your balance and may make you more likely to have a fall. Follow these instructions at home: Lifestyle Do not drink alcohol if: Your health care provider tells you not to drink. If you drink alcohol: Limit how much you have to: 0-1 drink a day for women. 0-2 drinks a day for men. Know how much alcohol is in your drink. In the [place] one drink equals one 12 oz bottle of beer  (355 mL), one 5 oz glass of wine (148 mL), or one 1 oz glass of hard liquor (44 mL). Do not use any  products that contain nicotine or tobacco. These products include cigarettes, chewing tobacco, and vaping devices, such as e-cigarettes. If you need help quitting, ask your health care provider. Activity  Follow a regular exercise program to stay fit. This will help you maintain your balance. Ask your health care provider what types of exercise are appropriate for you. If you need a cane or walker, use it as recommended by your health care provider. Wear supportive shoes that have nonskid soles. Safety  Remove any tripping hazards, such as rugs, cords, and clutter. Install safety equipment such as grab bars in bathrooms and safety rails on stairs. Keep rooms and walkways well-lit. General instructions Talk with your health care provider about your risks for falling. Tell your health care provider if: You fall. Be sure to tell your health care provider about all falls, even ones that seem minor. You feel dizzy, tiredness (fatigue), or off-balance. Take over-the-counter and prescription medicines only as told by your health care provider. These include supplements. Eat a healthy diet and maintain a healthy weight. A healthy diet includes low-fat dairy products, low-fat (lean) meats, and fiber from whole grains, beans, and lots of fruits and vegetables. Stay current with your vaccines. Schedule regular health, dental, and eye exams. Summary Having a healthy lifestyle and getting preventive care can help to protect your health and wellness after age 38. Screening and testing are the best way to find a health problem early and help you avoid having a fall. Early diagnosis and treatment give you the best chance for managing medical conditions that are more common for people who are older than age 23. Falls are a major cause of broken bones and head injuries in people who are older than age 52. Take  precautions to prevent a fall at home. Work with your health care provider to learn what changes you can make to improve your health and wellness and to prevent falls. This information is not intended to replace advice given to you by your health care provider. Make sure you discuss any questions you have with your health care provider. Document Revised: 03/30/2021 Document Reviewed: 03/30/2021 Elsevier Patient Education  2024 [employer].      Edwina Barth, MD [facility]

## 2024-05-22 ENCOUNTER — Other Ambulatory Visit: Payer: Self-pay | Admitting: Infectious Diseases

## 2024-05-22 ENCOUNTER — Ambulatory Visit
Admission: RE | Admit: 2024-05-22 | Discharge: 2024-05-22 | Disposition: A | Discharge status: Home / Self Care | Source: Ambulatory Visit | Attending: Infectious Diseases | Admitting: Infectious Diseases

## 2024-05-22 DIAGNOSIS — R7612 Nonspecific reaction to cell mediated immunity measurement of gamma interferon antigen response without active tuberculosis: Secondary | ICD-10-CM

## 2024-05-24 ENCOUNTER — Ambulatory Visit
Admission: RE | Admit: 2024-05-24 | Discharge: 2024-05-24 | Disposition: A | Discharge status: Home / Self Care | Source: Ambulatory Visit | Attending: Infectious Diseases | Admitting: Infectious Diseases

## 2024-05-24 ENCOUNTER — Other Ambulatory Visit: Payer: Self-pay | Admitting: Infectious Diseases

## 2024-05-24 DIAGNOSIS — Z111 Encounter for screening for respiratory tuberculosis: Secondary | ICD-10-CM

## 2024-09-18 ENCOUNTER — Ambulatory Visit: Payer: Self-pay | Admitting: Emergency Medicine

## 2024-12-04 ENCOUNTER — Telehealth: Payer: Self-pay | Admitting: Emergency Medicine

## 2024-12-04 ENCOUNTER — Ambulatory Visit: Payer: Self-pay | Admitting: Emergency Medicine

## 2024-12-04 ENCOUNTER — Encounter: Payer: Self-pay | Admitting: Emergency Medicine

## 2024-12-04 VITALS — BP 114/68 | HR 68 | Temp 97.9°F | Ht 67.0 in | Wt 216.0 lb

## 2024-12-04 DIAGNOSIS — I152 Hypertension secondary to endocrine disorders: Secondary | ICD-10-CM

## 2024-12-04 DIAGNOSIS — E785 Hyperlipidemia, unspecified: Secondary | ICD-10-CM | POA: Diagnosis not present

## 2024-12-04 DIAGNOSIS — E1159 Type 2 diabetes mellitus with other circulatory complications: Secondary | ICD-10-CM | POA: Diagnosis not present

## 2024-12-04 DIAGNOSIS — Z7984 Long term (current) use of oral hypoglycemic drugs: Secondary | ICD-10-CM

## 2024-12-04 DIAGNOSIS — E1169 Type 2 diabetes mellitus with other specified complication: Secondary | ICD-10-CM

## 2024-12-04 DIAGNOSIS — Z1211 Encounter for screening for malignant neoplasm of colon: Secondary | ICD-10-CM

## 2024-12-04 LAB — CBC WITH DIFFERENTIAL/PLATELET
Basophils Absolute: 0 K/uL (ref 0.0–0.1)
Basophils Relative: 0.5 % (ref 0.0–3.0)
Eosinophils Absolute: 0.2 K/uL (ref 0.0–0.7)
Eosinophils Relative: 3.1 % (ref 0.0–5.0)
HCT: 44.5 % (ref 39.0–52.0)
Hemoglobin: 14.7 g/dL (ref 13.0–17.0)
Lymphocytes Relative: 20.9 % (ref 12.0–46.0)
Lymphs Abs: 1.6 K/uL (ref 0.7–4.0)
MCHC: 33 g/dL (ref 30.0–36.0)
MCV: 80.2 fl (ref 78.0–100.0)
Monocytes Absolute: 0.7 K/uL (ref 0.1–1.0)
Monocytes Relative: 8.6 % (ref 3.0–12.0)
Neutro Abs: 5.1 K/uL (ref 1.4–7.7)
Neutrophils Relative %: 66.9 % (ref 43.0–77.0)
Platelets: 227 K/uL (ref 150.0–400.0)
RBC: 5.55 Mil/uL (ref 4.22–5.81)
RDW: 14.5 % (ref 11.5–15.5)
WBC: 7.6 K/uL (ref 4.0–10.5)

## 2024-12-04 LAB — COMPREHENSIVE METABOLIC PANEL WITH GFR
ALT: 24 U/L (ref 3–53)
AST: 25 U/L (ref 5–37)
Albumin: 4.2 g/dL (ref 3.5–5.2)
Alkaline Phosphatase: 84 U/L (ref 39–117)
BUN: 12 mg/dL (ref 6–23)
CO2: 31 meq/L (ref 19–32)
Calcium: 9.5 mg/dL (ref 8.4–10.5)
Chloride: 100 meq/L (ref 96–112)
Creatinine, Ser: 0.78 mg/dL (ref 0.40–1.50)
GFR: 89.7 mL/min
Glucose, Bld: 120 mg/dL — ABNORMAL HIGH (ref 70–99)
Potassium: 3.5 meq/L (ref 3.5–5.1)
Sodium: 138 meq/L (ref 135–145)
Total Bilirubin: 0.4 mg/dL (ref 0.2–1.2)
Total Protein: 7.6 g/dL (ref 6.0–8.3)

## 2024-12-04 LAB — POCT GLYCOSYLATED HEMOGLOBIN (HGB A1C): Hemoglobin A1C: 7.1 % — AB (ref 4.0–5.6)

## 2024-12-04 LAB — MICROALBUMIN / CREATININE URINE RATIO
Creatinine,U: 46.4 mg/dL
Microalb Creat Ratio: 110.3 mg/g — ABNORMAL HIGH (ref 0.0–30.0)
Microalb, Ur: 5.1 mg/dL — ABNORMAL HIGH (ref 0.7–1.9)

## 2024-12-04 LAB — LIPID PANEL
Cholesterol: 192 mg/dL (ref 28–200)
HDL: 40 mg/dL
LDL Cholesterol: 122 mg/dL — ABNORMAL HIGH (ref 10–99)
NonHDL: 151.95
Total CHOL/HDL Ratio: 5
Triglycerides: 150 mg/dL — ABNORMAL HIGH (ref 10.0–149.0)
VLDL: 30 mg/dL (ref 0.0–40.0)

## 2024-12-04 MED ORDER — METFORMIN HCL 500 MG PO TABS: 500.0000 mg | ORAL_TABLET | Freq: Two times a day (BID) | ORAL | 3 refills | Status: DC

## 2024-12-04 MED ORDER — LISINOPRIL-HYDROCHLOROTHIAZIDE 10-12.5 MG PO TABS: 1.0000 | ORAL_TABLET | Freq: Every day | ORAL | 3 refills | Status: DC

## 2024-12-04 MED ORDER — ATORVASTATIN CALCIUM 20 MG PO TABS: 20.0000 mg | ORAL_TABLET | Freq: Every day | ORAL | 3 refills | Status: DC

## 2024-12-04 NOTE — Assessment & Plan Note (Signed)
 Stable chronic conditions Hemoglobin A1c is 7.1. Continue metformin  500 mg twice a day and atorvastatin  20 mg daily Lipid profile done today

## 2024-12-04 NOTE — Patient Instructions (Signed)
 Health Maintenance After Age 72 After age 27, you are at a higher risk for certain long-term diseases and infections as well as injuries from falls. Falls are a major cause of broken bones and head injuries in people who are older than age 73. Getting regular preventive care can help to keep you healthy and well. Preventive care includes getting regular testing and making lifestyle changes as recommended by your health care provider. Talk with your health care provider about: Which screenings and tests you should have. A screening is a test that checks for a disease when you have no symptoms. A diet and exercise plan that is right for you. What should I know about screenings and tests to prevent falls? Screening and testing are the best ways to find a health problem early. Early diagnosis and treatment give you the best chance of managing medical conditions that are common after age 90. Certain conditions and lifestyle choices may make you more likely to have a fall. Your health care provider may recommend: Regular vision checks. Poor vision and conditions such as cataracts can make you more likely to have a fall. If you wear glasses, make sure to get your prescription updated if your vision changes. Medicine review. Work with your health care provider to regularly review all of the medicines you are taking, including over-the-counter medicines. Ask your health care provider about any side effects that may make you more likely to have a fall. Tell your health care provider if any medicines that you take make you feel dizzy or sleepy. Strength and balance checks. Your health care provider may recommend certain tests to check your strength and balance while standing, walking, or changing positions. Foot health exam. Foot pain and numbness, as well as not wearing proper footwear, can make you more likely to have a fall. Screenings, including: Osteoporosis screening. Osteoporosis is a condition that causes  the bones to get weaker and break more easily. Blood pressure screening. Blood pressure changes and medicines to control blood pressure can make you feel dizzy. Depression screening. You may be more likely to have a fall if you have a fear of falling, feel depressed, or feel unable to do activities that you used to do. Alcohol  use screening. Using too much alcohol  can affect your balance and may make you more likely to have a fall. Follow these instructions at home: Lifestyle Do not drink alcohol  if: Your health care provider tells you not to drink. If you drink alcohol : Limit how much you have to: 0-1 drink a day for women. 0-2 drinks a day for men. Know how much alcohol  is in your drink. In the [place] one drink equals one 12 oz bottle of beer (355 mL), one 5 oz glass of wine (148 mL), or one 1 oz glass of hard liquor (44 mL). Do not use any products that contain nicotine or tobacco. These products include cigarettes, chewing tobacco, and vaping devices, such as e-cigarettes. If you need help quitting, ask your health care provider. Activity  Follow a regular exercise program to stay fit. This will help you maintain your balance. Ask your health care provider what types of exercise are appropriate for you. If you need a cane or walker, use it as recommended by your health care provider. Wear supportive shoes that have nonskid soles. Safety  Remove any tripping hazards, such as rugs, cords, and clutter. Install safety equipment such as grab bars in bathrooms and safety rails on stairs. Keep rooms and walkways  well-lit. General instructions Talk with your health care provider about your risks for falling. Tell your health care provider if: You fall. Be sure to tell your health care provider about all falls, even ones that seem minor. You feel dizzy, tiredness (fatigue), or off-balance. Take over-the-counter and prescription medicines only as told by your health care provider. These include  supplements. Eat a healthy diet and maintain a healthy weight. A healthy diet includes low-fat dairy products, low-fat (lean) meats, and fiber from whole grains, beans, and lots of fruits and vegetables. Stay current with your vaccines. Schedule regular health, dental, and eye exams. Summary Having a healthy lifestyle and getting preventive care can help to protect your health and wellness after age 15. Screening and testing are the best way to find a health problem early and help you avoid having a fall. Early diagnosis and treatment give you the best chance for managing medical conditions that are more common for people who are older than age 42. Falls are a major cause of broken bones and head injuries in people who are older than age 64. Take precautions to prevent a fall at home. Work with your health care provider to learn what changes you can make to improve your health and wellness and to prevent falls. This information is not intended to replace advice given to you by your health care provider. Make sure you discuss any questions you have with your health care provider. Document Revised: 03/30/2021 Document Reviewed: 03/30/2021 Elsevier Patient Education  2024 [employer].

## 2024-12-04 NOTE — Assessment & Plan Note (Signed)
 BP Readings from Last 3 Encounters:  12/04/24 114/68  11/01/23 124/82  11/26/22 134/84  Well-controlled hypertension Continue Zestoretic  10-12.5 mg daily Lab Results  Component Value Date   HGBA1C 7.1 (A) 12/04/2024  Hemoglobin A1c a little higher than goal Continue metformin  500 mg twice a day Cardiovascular risks associated with hypertension and diabetes discussed Diet and nutrition discussed Blood work and urine done today Follow-up in 6 months

## 2024-12-04 NOTE — Telephone Encounter (Signed)
 PT requests change from current pharmacy to [facility] on [address]

## 2024-12-04 NOTE — Progress Notes (Signed)
 Gio Hardrick 74 y.o.   No chief complaint on file.   HISTORY OF PRESENT ILLNESS: This is a 74 y.o. male A1A here for follow-up of multiple chronic medical conditions Overall doing well.  Has no complaints or medical concerns today. Recently went to [place] and was also examined by doctors who told him he is doing well. No other complaints or medical concerns today. Wt Readings from Last 3 Encounters:  11/01/23 210 lb 6.4 oz (95.4 kg)  11/26/22 202 lb 13.2 oz (92 kg)  11/09/22 203 lb 12.8 oz (92.4 kg)   BP Readings from Last 3 Encounters:  11/01/23 124/82  11/26/22 134/84  11/09/22 (!) 135/94   Lab Results  Component Value Date   HGBA1C 6.6 (A) 11/01/2023     Diabetes Pertinent negatives for hypoglycemia include no dizziness or headaches. Pertinent negatives for diabetes include no chest pain.     Prior to Admission medications  Medication Sig Start Date End Date Taking? Authorizing Provider  atorvastatin  (LIPITOR) 20 MG tablet TAKE 1 TABLET BY MOUTH EVERY DAY 11/01/23   Skylen Spiering Jose, MD  lisinopril -hydrochlorothiazide  (ZESTORETIC ) 10-12.5 MG tablet TAKE 1 TABLET BY MOUTH EVERY DAY 11/01/23   [facility] Emil Schanz, MD  metFORMIN  (GLUCOPHAGE ) 500 MG tablet TAKE 1 TABLET BY MOUTH 2 TIMES DAILY WITH A MEAL. 10/23/23   [facility] Emil Schanz, MD    Allergies[1]  Patient Active Problem List   Diagnosis Date Noted   Nocturia 08/17/2022   Dyslipidemia associated with type 2 diabetes mellitus (HCC) 05/17/2022   Hypertension associated with diabetes (HCC) 11/25/2014    Past Medical History:  Diagnosis Date   Diabetes mellitus without complication (HCC)    Hypertension     Past Surgical History:  Procedure Laterality Date   HYDROCELE EXCISION Right 11/26/2022   Procedure: RIGHT HYDROCELECTOMY ADULT;  Surgeon: Carolee Sherwood JONETTA DOUGLAS, MD;  Location: [facility];  Service: Urology;  Laterality: Right;  1 HR FOR CASE    Social History   Socioeconomic History   Marital  status: Married    Spouse name: Not on file   Number of children: Not on file   Years of education: Not on file   Highest education level: Not on file  Occupational History   Not on file  Tobacco Use   Smoking status: Never   Smokeless tobacco: Never  Vaping Use   Vaping status: Never Used  Substance and Sexual Activity   Alcohol use: No    Alcohol/week: 0.0 standard drinks of alcohol   Drug use: No   Sexual activity: Not on file  Other Topics Concern   Not on file  Social History Narrative   Not on file   Social Drivers of Health   Tobacco Use: Low Risk (11/01/2023)   Patient History    Smoking Tobacco Use: Never    Smokeless Tobacco Use: Never    Passive Exposure: Not on file  Financial Resource Strain: Not on file  Food Insecurity: Not on file  Transportation Needs: Not on file  Physical Activity: Not on file  Stress: Not on file  Social Connections: Not on file  Intimate Partner Violence: Not on file  Depression (EYV7-0): Low Risk (11/01/2023)   Depression (PHQ2-9)    PHQ-2 Score: 0  Alcohol Screen: Not on file  Housing: Not on file  Utilities: Not on file  Health Literacy: Not on file    No family history on file.   Review of Systems  Constitutional: Negative.  Negative for  chills and fever.  HENT: Negative.  Negative for congestion and sore throat.   Cardiovascular: Negative.  Negative for chest pain and palpitations.  Gastrointestinal:  Negative for abdominal pain, nausea and vomiting.  Genitourinary: Negative.  Negative for dysuria and hematuria.  Skin: Negative.  Negative for rash.  Neurological: Negative.  Negative for dizziness and headaches.  All other systems reviewed and are negative.   Today's Vitals   12/04/24 1410  BP: 114/68  Pulse: 68  Temp: 97.9 F (36.6 C)  TempSrc: Temporal  SpO2: 97%  Weight: 216 lb (98 kg)  Height: 5' 7 (1.702 m)   Body mass index is 33.83 kg/m.   Physical Exam Vitals reviewed.  Constitutional:       Appearance: Normal appearance.  HENT:     Head: Normocephalic.     Mouth/Throat:     Mouth: Mucous membranes are moist.     Pharynx: Oropharynx is clear.  Eyes:     Extraocular Movements: Extraocular movements intact.     Conjunctiva/sclera: Conjunctivae normal.     Pupils: Pupils are equal, round, and reactive to light.  Cardiovascular:     Rate and Rhythm: Normal rate and regular rhythm.     Pulses: Normal pulses.     Heart sounds: Normal heart sounds.  Pulmonary:     Effort: Pulmonary effort is normal.     Breath sounds: Normal breath sounds.  Abdominal:     Palpations: Abdomen is soft.     Tenderness: There is no abdominal tenderness.  Musculoskeletal:     Cervical back: No tenderness.     Right lower leg: No edema.     Left lower leg: No edema.  Lymphadenopathy:     Cervical: No cervical adenopathy.  Skin:    General: Skin is warm and dry.  Neurological:     General: No focal deficit present.     Mental Status: He is alert and oriented to person, place, and time.  Psychiatric:        Mood and Affect: Mood normal.        Behavior: Behavior normal.    Results for orders placed or performed in visit on 12/04/24 (from the past 24 hours)  POCT HgB A1C     Status: Abnormal   Collection Time: 12/04/24  2:14 PM  Result Value Ref Range   Hemoglobin A1C 7.1 (A) 4.0 - 5.6 %   HbA1c POC (<> result, manual entry)     HbA1c, POC (prediabetic range)     HbA1c, POC (controlled diabetic range)       ASSESSMENT & PLAN: A total of 42 minutes was spent with the patient and counseling/coordination of care regarding preparing for this visit, review of most recent office visit notes, review of multiple chronic medical conditions and their management, cardiovascular risks associated with hypertension and diabetes, review of all medications, review of most recent bloodwork results including interpretation of today's hemoglobin A1c, review of health maintenance items, education on  nutrition, prognosis, documentation, and need for follow up.  Problem List Items Addressed This Visit       Cardiovascular and Mediastinum   Hypertension associated with diabetes (HCC) - Primary   BP Readings from Last 3 Encounters:  12/04/24 114/68  11/01/23 124/82  11/26/22 134/84  Well-controlled hypertension Continue Zestoretic  10-12.5 mg daily Lab Results  Component Value Date   HGBA1C 7.1 (A) 12/04/2024  Hemoglobin A1c a little higher than goal Continue metformin  500 mg twice a day Cardiovascular risks associated  with hypertension and diabetes discussed Diet and nutrition discussed Blood work and urine done today Follow-up in 6 months       Relevant Medications   atorvastatin  (LIPITOR) 20 MG tablet   lisinopril -hydrochlorothiazide  (ZESTORETIC ) 10-12.5 MG tablet   metFORMIN  (GLUCOPHAGE ) 500 MG tablet   Other Relevant Orders   POCT HgB A1C (Completed)   Microalbumin / creatinine urine ratio   Comprehensive metabolic panel with GFR   CBC with Differential/Platelet   Lipid panel   Ambulatory referral to Ophthalmology     Endocrine   Dyslipidemia associated with type 2 diabetes mellitus (HCC)   Stable chronic conditions Hemoglobin A1c is 7.1. Continue metformin  500 mg twice a day and atorvastatin  20 mg daily Lipid profile done today       Relevant Medications   atorvastatin  (LIPITOR) 20 MG tablet   lisinopril -hydrochlorothiazide  (ZESTORETIC ) 10-12.5 MG tablet   metFORMIN  (GLUCOPHAGE ) 500 MG tablet   Other Relevant Orders   POCT HgB A1C (Completed)   Microalbumin / creatinine urine ratio   Comprehensive metabolic panel with GFR   CBC with Differential/Platelet   Lipid panel   Other Visit Diagnoses       Screening for colon cancer       Relevant Orders   Cologuard      Patient Instructions  Health Maintenance After Age 12 After age 12, you are at a higher risk for certain long-term diseases and infections as well as injuries from falls. Falls are a  major cause of broken bones and head injuries in people who are older than age 35. Getting regular preventive care can help to keep you healthy and well. Preventive care includes getting regular testing and making lifestyle changes as recommended by your health care provider. Talk with your health care provider about: Which screenings and tests you should have. A screening is a test that checks for a disease when you have no symptoms. A diet and exercise plan that is right for you. What should I know about screenings and tests to prevent falls? Screening and testing are the best ways to find a health problem early. Early diagnosis and treatment give you the best chance of managing medical conditions that are common after age 72. Certain conditions and lifestyle choices may make you more likely to have a fall. Your health care provider may recommend: Regular vision checks. Poor vision and conditions such as cataracts can make you more likely to have a fall. If you wear glasses, make sure to get your prescription updated if your vision changes. Medicine review. Work with your health care provider to regularly review all of the medicines you are taking, including over-the-counter medicines. Ask your health care provider about any side effects that may make you more likely to have a fall. Tell your health care provider if any medicines that you take make you feel dizzy or sleepy. Strength and balance checks. Your health care provider may recommend certain tests to check your strength and balance while standing, walking, or changing positions. Foot health exam. Foot pain and numbness, as well as not wearing proper footwear, can make you more likely to have a fall. Screenings, including: Osteoporosis screening. Osteoporosis is a condition that causes the bones to get weaker and break more easily. Blood pressure screening. Blood pressure changes and medicines to control blood pressure can make you feel  dizzy. Depression screening. You may be more likely to have a fall if you have a fear of falling, feel depressed, or feel unable  to do activities that you used to do. Alcohol use screening. Using too much alcohol can affect your balance and may make you more likely to have a fall. Follow these instructions at home: Lifestyle Do not drink alcohol if: Your health care provider tells you not to drink. If you drink alcohol: Limit how much you have to: 0-1 drink a day for women. 0-2 drinks a day for men. Know how much alcohol is in your drink. In the [place] one drink equals one 12 oz bottle of beer (355 mL), one 5 oz glass of wine (148 mL), or one 1 oz glass of hard liquor (44 mL). Do not use any products that contain nicotine or tobacco. These products include cigarettes, chewing tobacco, and vaping devices, such as e-cigarettes. If you need help quitting, ask your health care provider. Activity  Follow a regular exercise program to stay fit. This will help you maintain your balance. Ask your health care provider what types of exercise are appropriate for you. If you need a cane or walker, use it as recommended by your health care provider. Wear supportive shoes that have nonskid soles. Safety  Remove any tripping hazards, such as rugs, cords, and clutter. Install safety equipment such as grab bars in bathrooms and safety rails on stairs. Keep rooms and walkways well-lit. General instructions Talk with your health care provider about your risks for falling. Tell your health care provider if: You fall. Be sure to tell your health care provider about all falls, even ones that seem minor. You feel dizzy, tiredness (fatigue), or off-balance. Take over-the-counter and prescription medicines only as told by your health care provider. These include supplements. Eat a healthy diet and maintain a healthy weight. A healthy diet includes low-fat dairy products, low-fat (lean) meats, and fiber from whole  grains, beans, and lots of fruits and vegetables. Stay current with your vaccines. Schedule regular health, dental, and eye exams. Summary Having a healthy lifestyle and getting preventive care can help to protect your health and wellness after age 72. Screening and testing are the best way to find a health problem early and help you avoid having a fall. Early diagnosis and treatment give you the best chance for managing medical conditions that are more common for people who are older than age 70. Falls are a major cause of broken bones and head injuries in people who are older than age 79. Take precautions to prevent a fall at home. Work with your health care provider to learn what changes you can make to improve your health and wellness and to prevent falls. This information is not intended to replace advice given to you by your health care provider. Make sure you discuss any questions you have with your health care provider. Document Revised: 03/30/2021 Document Reviewed: 03/30/2021 Elsevier Patient Education  2024 [employer]      Emil Schaumann, MD [facility]    [1] No Known Allergies

## 2024-12-05 ENCOUNTER — Ambulatory Visit: Payer: Self-pay | Admitting: Emergency Medicine

## 2024-12-05 NOTE — Telephone Encounter (Signed)
This has been changed and up dated.

## 2024-12-28 ENCOUNTER — Other Ambulatory Visit: Payer: Self-pay | Admitting: Emergency Medicine

## 2024-12-28 DIAGNOSIS — E1169 Type 2 diabetes mellitus with other specified complication: Secondary | ICD-10-CM

## 2024-12-28 DIAGNOSIS — I152 Hypertension secondary to endocrine disorders: Secondary | ICD-10-CM

## 2024-12-28 LAB — COLOGUARD: COLOGUARD: NEGATIVE
